# Patient Record
Sex: Female | Born: 1965 | Race: White | Hispanic: No | Marital: Married | Smoking: Never smoker
Health system: Southern US, Community
[De-identification: ages and names within clinical notes are randomized; demographics above are authoritative.]

---

## 1998-11-02 ENCOUNTER — Ambulatory Visit (HOSPITAL_COMMUNITY): Admission: RE | Admit: 1998-11-02 | Discharge: 1998-11-02 | Payer: Self-pay | Admitting: *Deleted

## 1999-11-02 ENCOUNTER — Other Ambulatory Visit: Admission: RE | Admit: 1999-11-02 | Discharge: 1999-11-02 | Payer: Self-pay | Admitting: *Deleted

## 2000-09-02 ENCOUNTER — Other Ambulatory Visit: Admission: RE | Admit: 2000-09-02 | Discharge: 2000-09-02 | Payer: Self-pay | Admitting: *Deleted

## 2000-11-14 ENCOUNTER — Encounter (INDEPENDENT_AMBULATORY_CARE_PROVIDER_SITE_OTHER): Payer: Self-pay | Admitting: Specialist

## 2000-11-14 ENCOUNTER — Ambulatory Visit (HOSPITAL_COMMUNITY): Admission: RE | Admit: 2000-11-14 | Discharge: 2000-11-14 | Payer: Self-pay | Admitting: *Deleted

## 2002-02-10 ENCOUNTER — Other Ambulatory Visit: Admission: RE | Admit: 2002-02-10 | Discharge: 2002-02-10 | Payer: Self-pay | Admitting: *Deleted

## 2003-04-26 ENCOUNTER — Other Ambulatory Visit: Admission: RE | Admit: 2003-04-26 | Discharge: 2003-04-26 | Payer: Self-pay | Admitting: *Deleted

## 2004-08-17 ENCOUNTER — Other Ambulatory Visit: Admission: RE | Admit: 2004-08-17 | Discharge: 2004-08-17 | Payer: Self-pay | Admitting: Obstetrics and Gynecology

## 2005-10-12 ENCOUNTER — Other Ambulatory Visit: Admission: RE | Admit: 2005-10-12 | Discharge: 2005-10-12 | Payer: Self-pay | Admitting: Obstetrics and Gynecology

## 2010-07-25 ENCOUNTER — Other Ambulatory Visit: Admission: RE | Admit: 2010-07-25 | Discharge: 2010-07-25 | Payer: Self-pay | Admitting: Gynecology

## 2010-07-25 ENCOUNTER — Ambulatory Visit: Payer: Self-pay | Admitting: Gynecology

## 2010-08-11 ENCOUNTER — Ambulatory Visit: Payer: Self-pay | Admitting: Gynecology

## 2011-04-20 NOTE — Op Note (Signed)
[facility]  Patient:    Nancy Quinn, Nancy Quinn                        MRN: [id number] Proc. Date: 11/14/00 Adm. Date:  [id number] Attending:  Donne Hazel                           Operative Report  PREOPERATIVE DIAGNOSIS:  Intrauterine fetal demise, first trimester.  POSTOPERATIVE DIAGNOSIS:  Intrauterine fetal demise, first trimester.  PROCEDURE:  D&E.  SURGEON:  R. Alan Mulder, M.D.  ANESTHESIA:  MAC with local supplementation.  ESTIMATED BLOOD LOSS:  50 cc.  COMPLICATIONS:  None.  FINDINGS:  Products of conception.  DESCRIPTION OF PROCEDURE:  The patient was taken to the operating room where a MAC anesthetic was administered.  The patient was placed on the operating table in the dorsolithotomy position.  The perineum and vagina was prepped and draped in the usual sterile fashion with Betadine and sterile drapes.  A speculum was placed in the vagina and the anterior lip of the cervix was grasped with a single tooth tenaculum.  The cervix was then serially dilated until a #25 Pratt dilator could easily enter the intracervical os.  Prior to this, a paracervical block was placed in the 3 oclock and 9 oclock position with 10 cc of 1% chloroprocaine or Nesacaine.  Next, a #8 curved suction curet was placed in the intrauterine cavity and the uterus was emptied in the usual fashion with a suction curet.  Three passes were made throughout the intrauterine cavity with products of conception aspirated.  The uterus was then sharply curetted with a small serrated curet and noted to be empty.  Two final passes were then made throughout the intrauterine cavity with the suction curet.  All vaginal instruments were removed.  The uterus was noted to be empty.  All vaginal instruments were removed.  The patient was then awakened and taken to the recovery room in good condition.  There were no perioperative complications.  Blood type B-positive. DD:   11/14/00 TD:  11/15/00 Job: [id number]

## 2011-08-07 ENCOUNTER — Other Ambulatory Visit: Payer: Self-pay | Admitting: *Deleted

## 2011-08-07 DIAGNOSIS — F419 Anxiety disorder, unspecified: Secondary | ICD-10-CM

## 2011-08-07 MED ORDER — SERTRALINE HCL 50 MG PO TABS: 50.0000 mg | ORAL_TABLET | Freq: Every day | ORAL | Status: DC

## 2011-10-01 ENCOUNTER — Other Ambulatory Visit: Payer: Self-pay | Admitting: *Deleted

## 2011-10-01 DIAGNOSIS — F419 Anxiety disorder, unspecified: Secondary | ICD-10-CM

## 2011-10-01 MED ORDER — SERTRALINE HCL 50 MG PO TABS: 50.0000 mg | ORAL_TABLET | Freq: Every day | ORAL | Status: DC

## 2011-10-04 ENCOUNTER — Encounter: Payer: Self-pay | Admitting: *Deleted

## 2011-10-04 NOTE — Progress Notes (Signed)
Called pharmacy to see if pt rx for sertraline 50 mg was filled and it has been.

## 2011-10-11 NOTE — Progress Notes (Signed)
CALLED PHARMACY AND LM THAT PT NO REFILL AFTER 10/01/11 REFILL DUE TO PT NEEDS TO MAKE APPOINTMENT FOR HER ANNUAL.

## 2012-01-04 ENCOUNTER — Encounter: Payer: Self-pay | Admitting: Gynecology

## 2012-01-04 ENCOUNTER — Ambulatory Visit (INDEPENDENT_AMBULATORY_CARE_PROVIDER_SITE_OTHER): Payer: 59 | Admitting: Gynecology

## 2012-01-04 ENCOUNTER — Other Ambulatory Visit (HOSPITAL_COMMUNITY)
Admission: RE | Admit: 2012-01-04 | Discharge: 2012-01-04 | Disposition: A | Discharge status: Home / Self Care | Payer: 59 | Source: Ambulatory Visit | Attending: Gynecology | Admitting: Gynecology

## 2012-01-04 VITALS — BP 116/76 | Ht 64.0 in | Wt 144.0 lb

## 2012-01-04 DIAGNOSIS — Z01419 Encounter for gynecological examination (general) (routine) without abnormal findings: Secondary | ICD-10-CM

## 2012-01-04 DIAGNOSIS — F419 Anxiety disorder, unspecified: Secondary | ICD-10-CM

## 2012-01-04 DIAGNOSIS — F411 Generalized anxiety disorder: Secondary | ICD-10-CM

## 2012-01-04 DIAGNOSIS — Z131 Encounter for screening for diabetes mellitus: Secondary | ICD-10-CM

## 2012-01-04 DIAGNOSIS — Z13220 Encounter for screening for lipoid disorders: Secondary | ICD-10-CM

## 2012-01-04 LAB — URINALYSIS W MICROSCOPIC + REFLEX CULTURE
Casts: NONE SEEN
Crystals: NONE SEEN
Leukocytes, UA: NEGATIVE
Nitrite: NEGATIVE
Specific Gravity, Urine: 1.015 (ref 1.005–1.030)
Urobilinogen, UA: 0.2 mg/dL (ref 0.0–1.0)
pH: 8.5 — ABNORMAL HIGH (ref 5.0–8.0)

## 2012-01-04 MED ORDER — SERTRALINE HCL 50 MG PO TABS: 50.0000 mg | ORAL_TABLET | Freq: Every day | ORAL | Status: DC

## 2012-01-04 MED ORDER — NORETHINDRONE ACET-ETHINYL EST 1-20 MG-MCG PO TABS: 1.0000 | ORAL_TABLET | Freq: Every day | ORAL | Status: DC

## 2012-01-04 NOTE — Patient Instructions (Signed)
Resume Zoloft as we discussed. Follow up in one year for your annual gynecologic check up.

## 2012-01-04 NOTE — Progress Notes (Signed)
Addended by: Richardson Chiquito on: 01/04/2012 03:37 PM   Modules accepted: Orders

## 2012-01-04 NOTE — Progress Notes (Signed)
Nancy Quinn 10/24/1966 [id number]        46 y.o.  for annual exam.  Doing well.  Past medical history,surgical history, medications, allergies, family history and social history were all reviewed and documented in the EPIC chart. ROS:  Was performed and pertinent positives and negatives are included in the history.  Exam: Sherrilyn Rist chaperone present Filed Vitals:   01/04/12 1500  BP: 116/76   General appearance  Normal Skin grossly normal Head/Neck normal with no cervical or supraclavicular adenopathy thyroid normal Lungs  clear Cardiac RR, without RMG Abdominal  soft, nontender, without masses, organomegaly or hernia Breasts  examined lying and sitting without masses, retractions, discharge or axillary adenopathy. Pelvic  Ext/BUS/vagina  normal   Cervix  normal  Pap done  Uterus  anteverted, normal size, shape and contour, midline and mobile nontender   Adnexa  Without masses or tenderness    Anus and perineum  normal   Rectovaginal  normal sphincter tone without palpated masses or tenderness.    Assessment/Plan:  46 y.o. female for annual exam.    1. Contraception. Patient is on Loestrin 120 equivalent wants to continue. Risks of stroke heart attack DVT have been discussed understood and accepted. A refill for times a year. 2. Anxiety/stress. Patient's a new job having a lot more stress associated with this.  Had been on Zoloft 50 mg doing well but she stopped it herself in August initially did well but now feels that she needs to restart it. I refilled her times a year. I discussed side effect profile as well as the need to report feeling worse instead of feeling better in the issues of suicide ideation. 3. Breast health. She's not had a mammogram and I strongly urged her to schedule this and she agrees. SBE monthly reviewed. 4. Pap smear. I did a Pap smear today as she only has one normal in our chart. She has no history of abnormalities in the past. 5. Health maintenance. Will  check baseline CBC glucose lipid profile and urinalysis. Assuming she continues well for gynecologic standpoint she will see me in a year, sooner as needed.    Dara Lords MD, 3:22 PM 01/04/2012

## 2012-01-05 LAB — CBC WITH DIFFERENTIAL/PLATELET
Basophils Absolute: 0 10*3/uL (ref 0.0–0.1)
Basophils Relative: 1 % (ref 0–1)
Eosinophils Absolute: 0 10*3/uL (ref 0.0–0.7)
Eosinophils Relative: 0 % (ref 0–5)
Lymphocytes Relative: 35 % (ref 12–46)
MCHC: 32.2 g/dL (ref 30.0–36.0)
MCV: 96.5 fL (ref 78.0–100.0)
Monocytes Absolute: 0.3 10*3/uL (ref 0.1–1.0)
Platelets: 299 10*3/uL (ref 150–400)
RDW: 12.1 % (ref 11.5–15.5)
WBC: 6.4 10*3/uL (ref 4.0–10.5)

## 2012-01-05 LAB — LIPID PANEL
LDL Cholesterol: 174 mg/dL — ABNORMAL HIGH (ref 0–99)
Triglycerides: 80 mg/dL (ref <150)
VLDL: 16 mg/dL (ref 0–40)

## 2012-01-09 ENCOUNTER — Other Ambulatory Visit: Payer: Self-pay | Admitting: *Deleted

## 2012-01-09 DIAGNOSIS — E7800 Pure hypercholesterolemia, unspecified: Secondary | ICD-10-CM

## 2012-05-09 ENCOUNTER — Encounter: Payer: Self-pay | Admitting: Family Medicine

## 2012-05-09 ENCOUNTER — Ambulatory Visit
Admission: RE | Admit: 2012-05-09 | Discharge: 2012-05-09 | Disposition: A | Discharge status: Home / Self Care | Payer: 59 | Source: Ambulatory Visit | Attending: Family Medicine | Admitting: Family Medicine

## 2012-05-09 ENCOUNTER — Ambulatory Visit (INDEPENDENT_AMBULATORY_CARE_PROVIDER_SITE_OTHER): Payer: 59 | Admitting: Family Medicine

## 2012-05-09 VITALS — BP 138/86 | HR 72 | Wt 146.0 lb

## 2012-05-09 DIAGNOSIS — M79671 Pain in right foot: Secondary | ICD-10-CM

## 2012-05-09 DIAGNOSIS — M79609 Pain in unspecified limb: Secondary | ICD-10-CM

## 2012-05-09 NOTE — Progress Notes (Signed)
  Subjective:    Patient ID: Nancy Quinn, female    DOB: 1966/01/02, 46 y.o.   MRN: [id number]  HPI She is here for evaluation of a two-year history of right mid foot pain. No history of previous injury to this area. No relation to running or other physical activities. She has used orthotics in the past without success . The pain is present whether active or sitting . She does not complaining of numbness. She has tried different shoes and now is wearing shoes that apparently put some weight on the lateral aspect of her foot which has helped.    Review of Systems     Objective:   Physical Exam Full motion of the ankle. No laxity is noted. Strength is normal. She does have tenderness to palpation over the talonavicular area. Tinel's test is negative       Assessment & Plan:  Foot pain, etiology unclear. Refer to orthopedics

## 2012-05-09 NOTE — Patient Instructions (Signed)
The orthopedic office will call you with an appointment

## 2013-01-26 ENCOUNTER — Other Ambulatory Visit: Payer: Self-pay

## 2013-01-26 MED ORDER — NORETHIN ACE-ETH ESTRAD-FE 1-20 MG-MCG PO TABS: 1.0000 | ORAL_TABLET | Freq: Every day | ORAL | Status: DC

## 2013-01-26 MED ORDER — SERTRALINE HCL 50 MG PO TABS: 50.0000 mg | ORAL_TABLET | Freq: Every day | ORAL | Status: DC

## 2013-01-30 ENCOUNTER — Other Ambulatory Visit: Payer: Self-pay | Admitting: *Deleted

## 2013-01-30 NOTE — Telephone Encounter (Signed)
Annual scheduled for end of March 2014

## 2013-02-02 ENCOUNTER — Other Ambulatory Visit: Payer: Self-pay

## 2013-02-02 MED ORDER — NORETHIN ACE-ETH ESTRAD-FE 1-20 MG-MCG PO TABS: 1.0000 | ORAL_TABLET | Freq: Every day | ORAL | Status: DC

## 2013-02-03 ENCOUNTER — Other Ambulatory Visit: Payer: Self-pay | Admitting: *Deleted

## 2013-02-03 MED ORDER — NORETHIN ACE-ETH ESTRAD-FE 1-20 MG-MCG PO TABS: 1.0000 | ORAL_TABLET | Freq: Every day | ORAL | Status: DC

## 2013-02-06 ENCOUNTER — Encounter: Payer: 59 | Admitting: Gynecology

## 2013-02-27 ENCOUNTER — Encounter: Payer: 59 | Admitting: Gynecology

## 2013-03-10 ENCOUNTER — Other Ambulatory Visit: Payer: Self-pay

## 2013-03-10 DIAGNOSIS — F419 Anxiety disorder, unspecified: Secondary | ICD-10-CM

## 2013-03-10 MED ORDER — SERTRALINE HCL 50 MG PO TABS: 50.0000 mg | ORAL_TABLET | Freq: Every day | ORAL | Status: DC

## 2013-03-10 NOTE — Telephone Encounter (Signed)
Patient has her yearly exam scheduled 03/13/13.

## 2013-03-13 ENCOUNTER — Encounter: Payer: Self-pay | Admitting: Gynecology

## 2013-06-08 IMAGING — CR DG FOOT COMPLETE 3+V*R*
3 series · 3 of 3 positions shown · non-contrast
Comparison: None.

CLINICAL DATA: Foot pain.  Runner.  Medial pain near navicular.

RIGHT FOOT COMPLETE - 3+ VIEW

[view not recorded (1 of 3)]
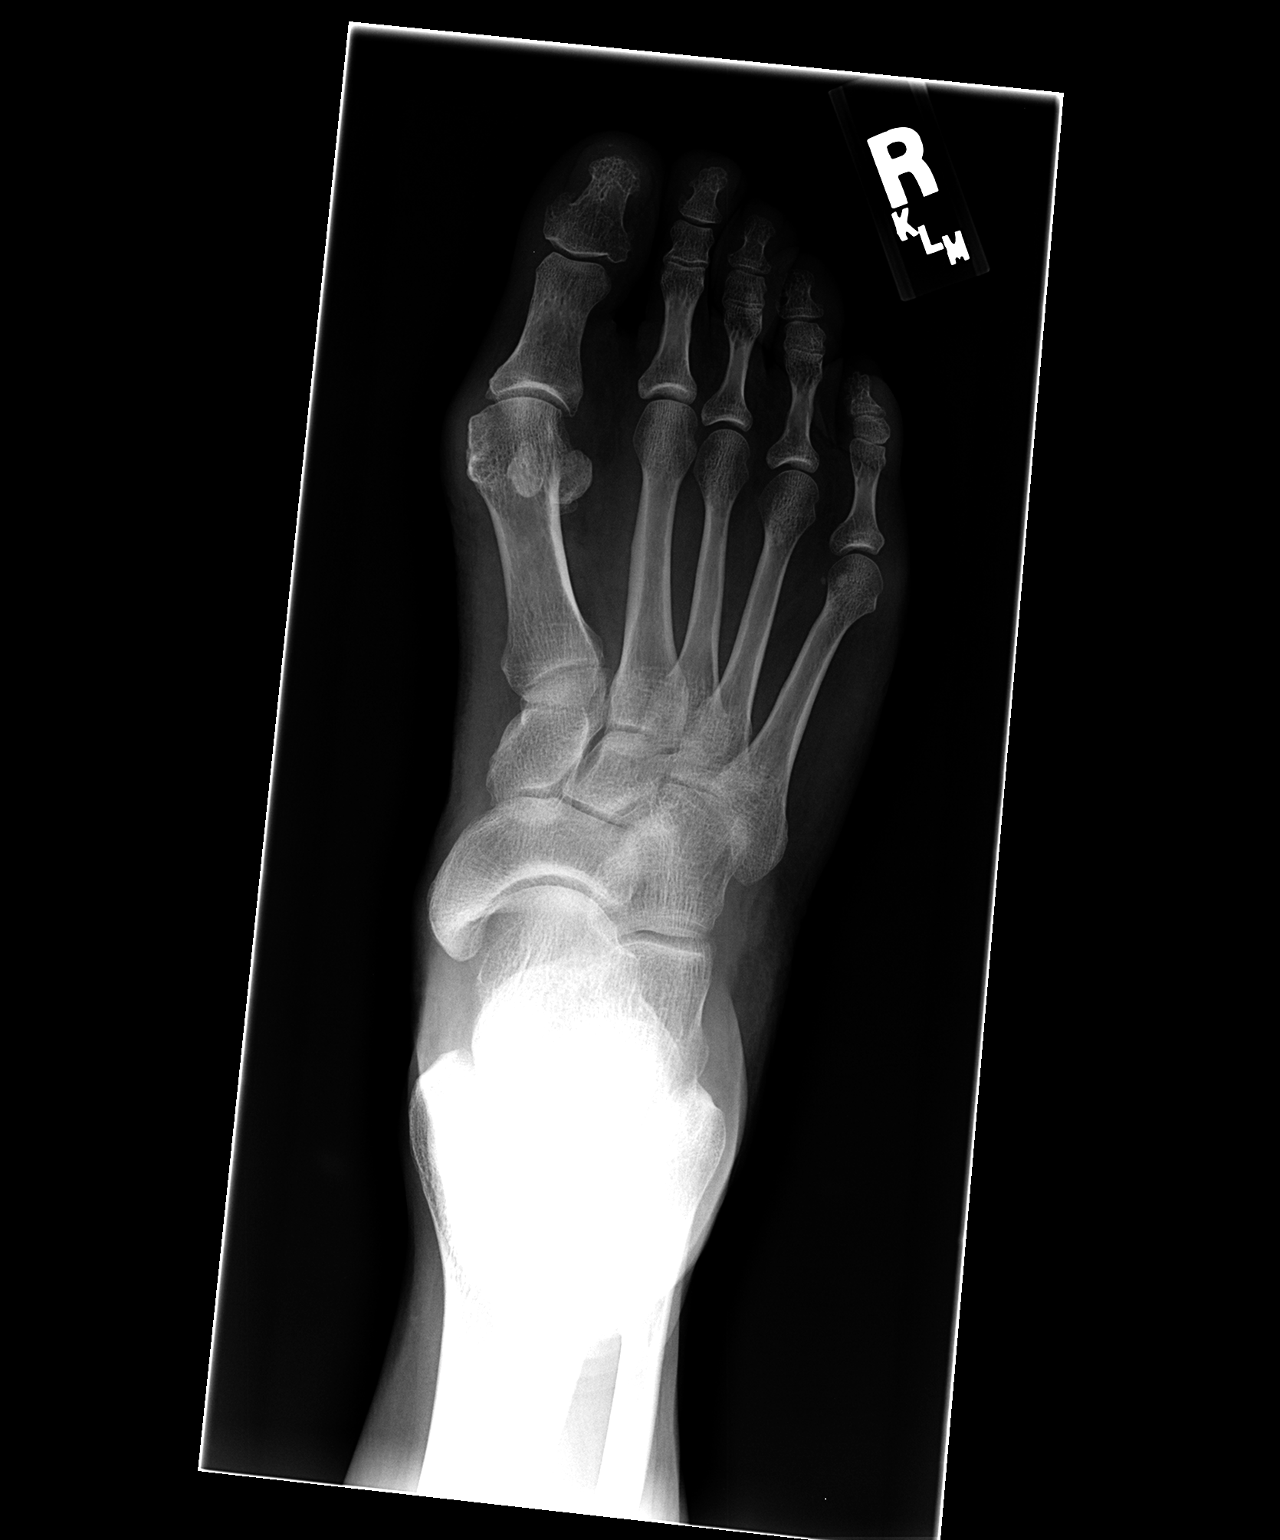

[view not recorded (2 of 3)]
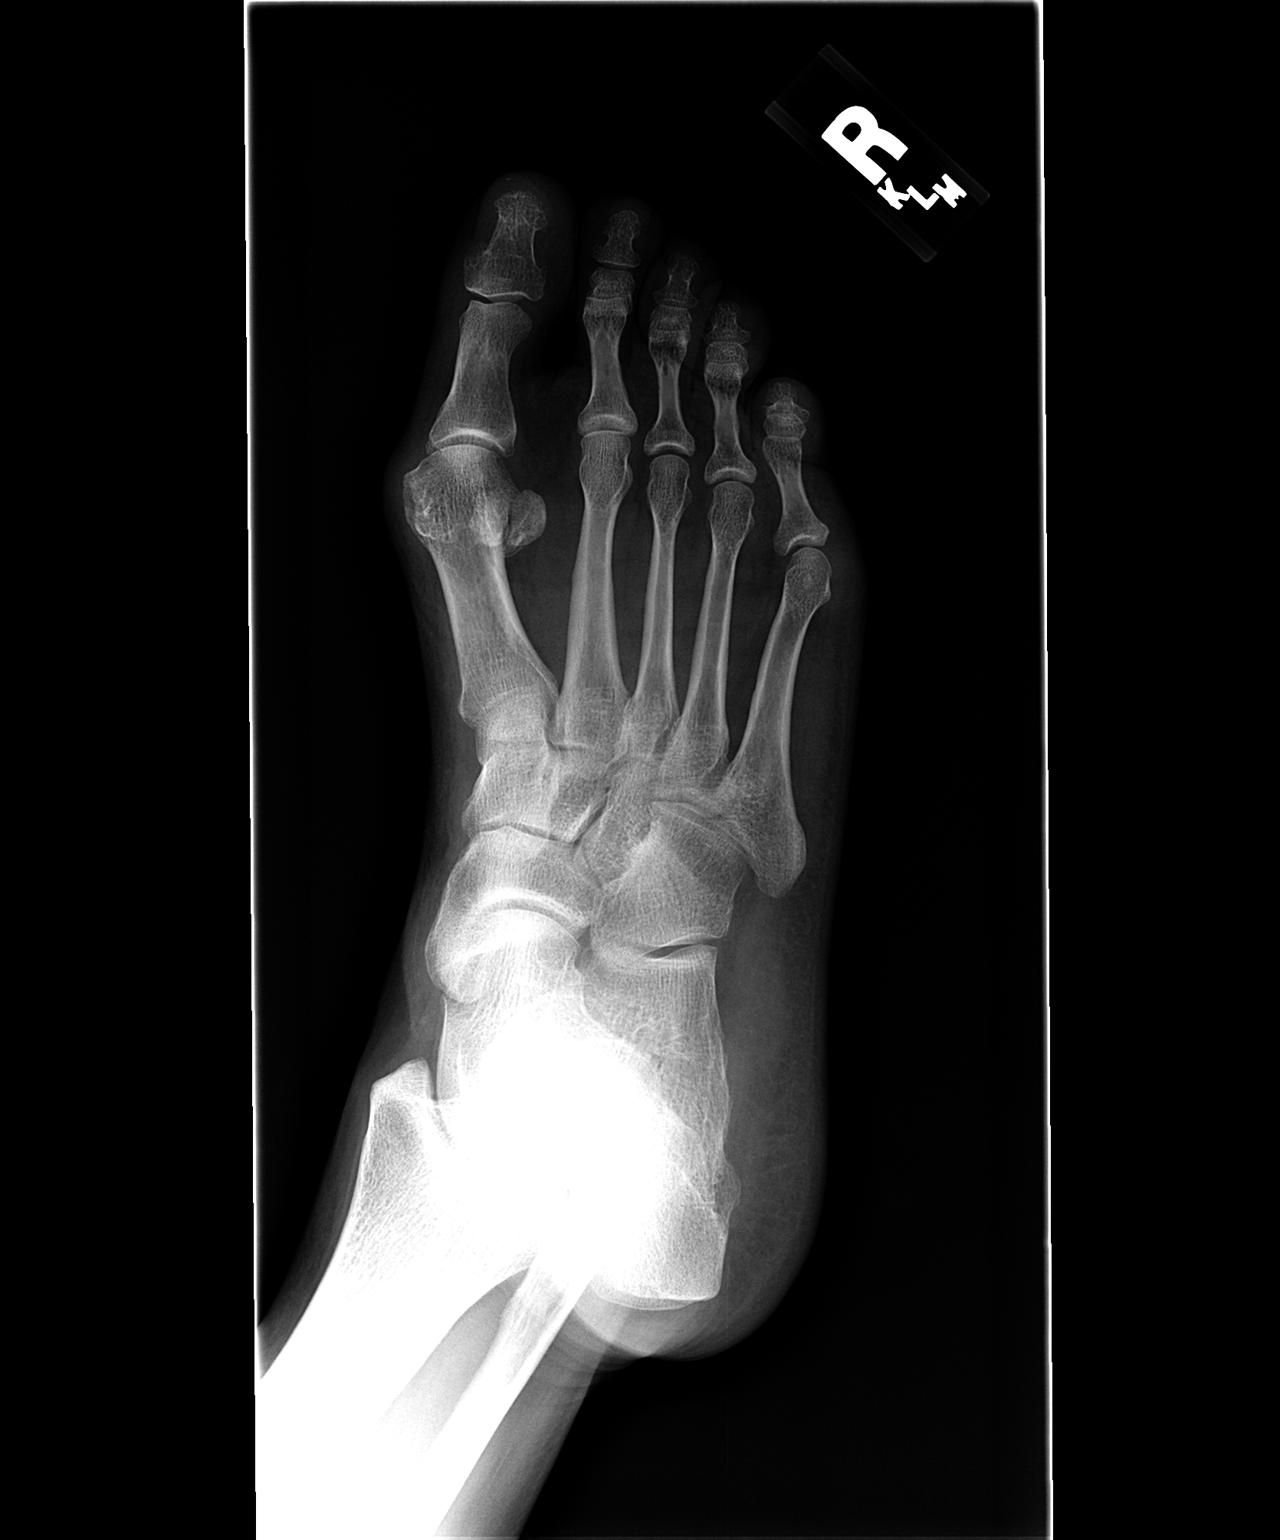

[view not recorded (3 of 3)]
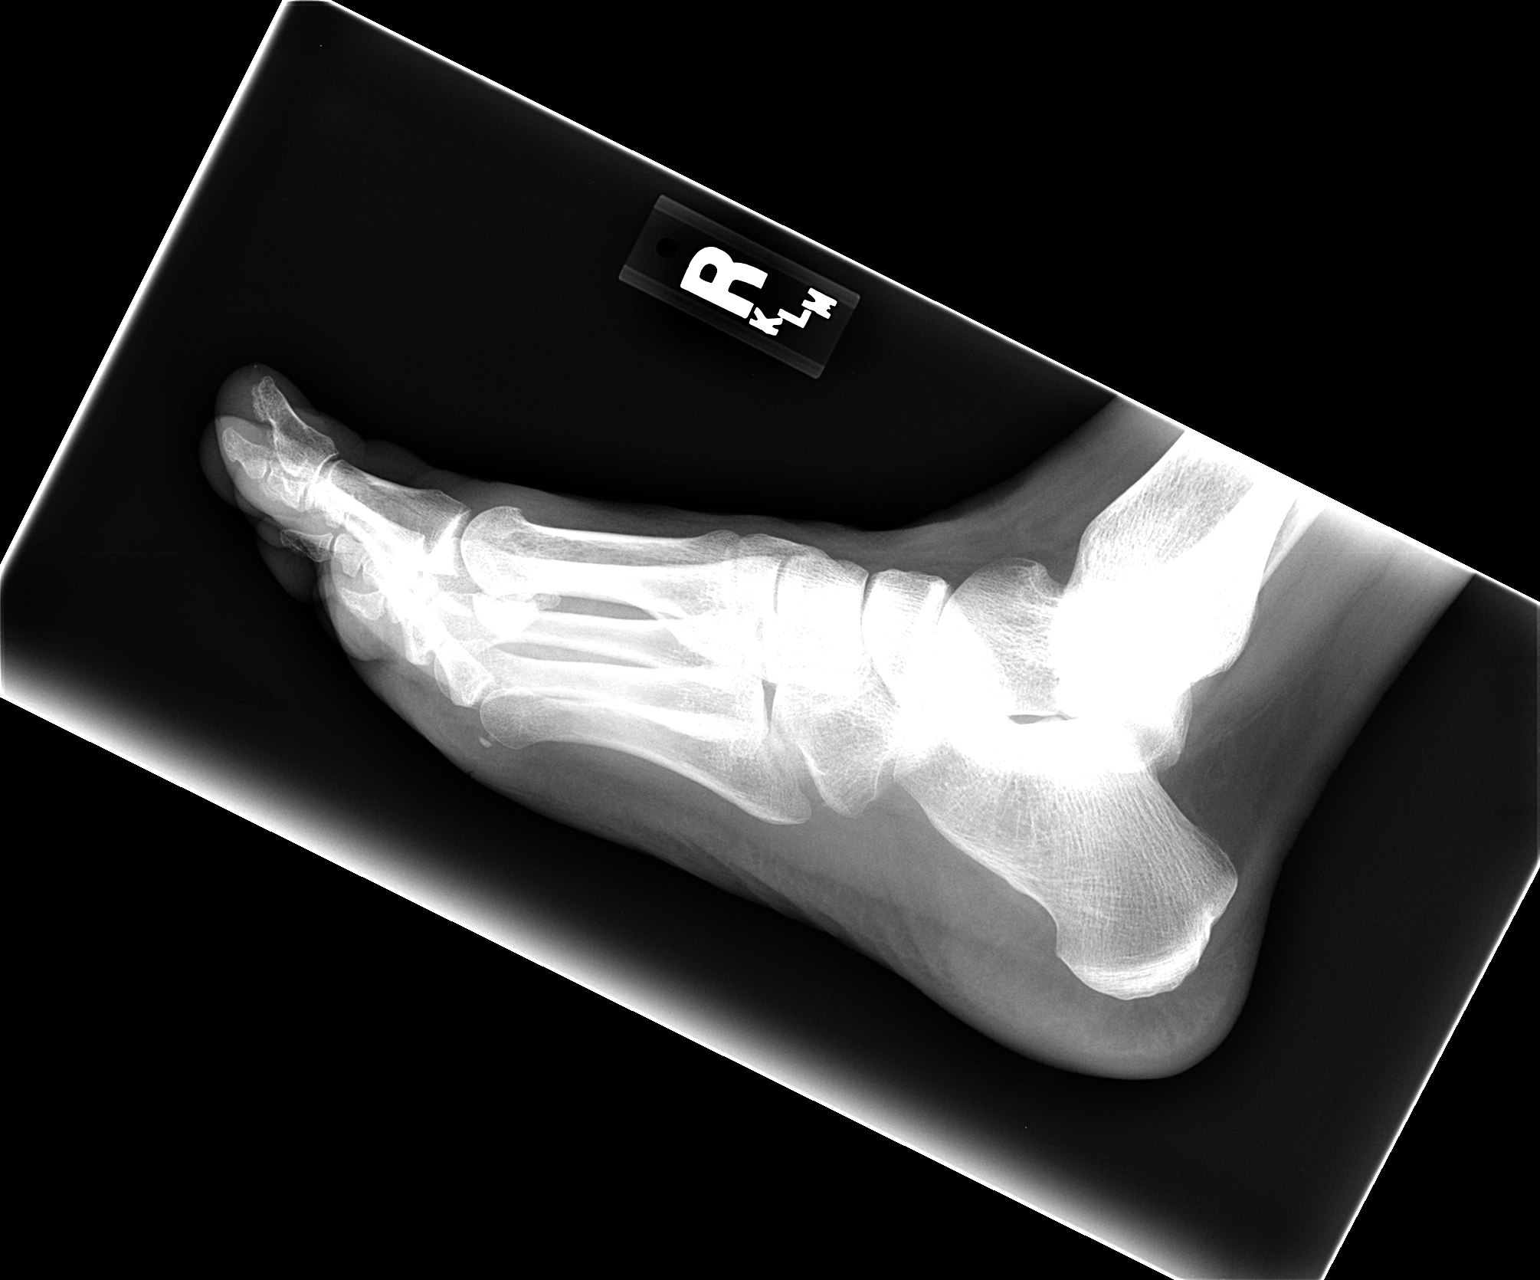

[3 of 3 positions shown; findings below may reference images not displayed]

FINDINGS: Mild hallux valgus deformity. No acute fracture or
dislocation.  No periosteal reaction or callus deposition.
IMPRESSION: No acute osseous abnormality.

## 2013-09-16 ENCOUNTER — Telehealth: Payer: Self-pay | Admitting: Family Medicine

## 2013-09-16 NOTE — Telephone Encounter (Signed)
Pt's husband called and stated that they would like to have you recommend another ortho. Doc. They are interested in getting a second opinion. Please call pt.

## 2013-09-17 NOTE — Telephone Encounter (Signed)
Second opinion given

## 2014-10-04 ENCOUNTER — Encounter: Payer: Self-pay | Admitting: Family Medicine

## 2019-12-31 ENCOUNTER — Other Ambulatory Visit: Payer: Self-pay

## 2019-12-31 ENCOUNTER — Encounter: Payer: Self-pay | Admitting: Family Medicine

## 2019-12-31 ENCOUNTER — Ambulatory Visit: Payer: BC Managed Care – PPO | Admitting: Family Medicine

## 2019-12-31 VITALS — HR 91 | Temp 98.7°F | Wt 135.0 lb

## 2019-12-31 DIAGNOSIS — H9209 Otalgia, unspecified ear: Secondary | ICD-10-CM

## 2019-12-31 DIAGNOSIS — R509 Fever, unspecified: Secondary | ICD-10-CM | POA: Diagnosis not present

## 2019-12-31 DIAGNOSIS — R42 Dizziness and giddiness: Secondary | ICD-10-CM

## 2019-12-31 DIAGNOSIS — R0981 Nasal congestion: Secondary | ICD-10-CM | POA: Diagnosis not present

## 2019-12-31 DIAGNOSIS — R519 Headache, unspecified: Secondary | ICD-10-CM

## 2019-12-31 NOTE — Progress Notes (Signed)
   Subjective:  Documentation for virtual audio and video telecommunications through [place] encounter:  The patient was located at home. 2 patient identifiers used.  The provider was located in the office. The patient did consent to this visit and is aware of possible charges through their insurance for this visit.  The other persons participating in this telemedicine service were her husband.     Patient ID: Nancy Quinn, female    DOB: 1965-12-25, 54 y.o.   MRN: [id number]  HPI Chief Complaint  Patient presents with  . ear infection    vertigo during middle of night for last 2 nights, left ear inflammed, sore.    Complains of intermittent year long history of dizziness in the middle of the night  Spinning sensation when she lays her head back.    2 nights ago she was awakened 6 times with this.  Episodes are brief typically, lasting longer for the past 2 days.   She also reports episodes of feeling hot and then cold, nasal congestion, frontal headache, left ear pain and decreased appetite  Feels well hydrated. Has been taking ibuprofen.   States she has been diagnosed with glaucoma.   Denies numbness, tingling or weakness. No chest pain, palpitations, shortness of breath, cough, abdominal pain, N/V/D.  No loss of taste or smell.   Reviewed allergies, medications, past medical, surgical, family, and social history.  Review of Systems Pertinent positives and negatives in the history of present illness.     Objective:   Physical Exam Pulse 91   Temp 98.7 F (37.1 C)   Wt 135 lb (61.2 kg)   BMI 23.17 kg/m   Alert and oriented and in no acute distress.  No facial asymmetry.  Respirations unlabored.  Normal speech, mood and thought process.      Assessment & Plan:  Dizziness  Nasal congestion  Otalgia, unspecified laterality  Chills with fever  Acute nonintractable headache, unspecified headache type  Discussed limitations of a virtual visit.  Dizziness  x 1 year intermittent and with position changes that does sound like BPPV. No red flag symptoms.  Recommend symptomatic treatment with meclizine. She may also take Tylenol or ibuprofen for headache. Hydrate and rest.  She will quarantine and get a Covid test through [facility].  Discussed the possibility of underlying sinusitis vs ear infection. Asked her to keep me updated on any new symptoms.  Follow up with me Monday virtually or she may need to be seen sooner if worsening.

## 2020-01-01 ENCOUNTER — Ambulatory Visit: Payer: BC Managed Care – PPO | Attending: Internal Medicine

## 2020-01-01 DIAGNOSIS — Z20822 Contact with and (suspected) exposure to covid-19: Secondary | ICD-10-CM

## 2020-01-02 LAB — NOVEL CORONAVIRUS, NAA: SARS-CoV-2, NAA: NOT DETECTED

## 2020-01-04 ENCOUNTER — Other Ambulatory Visit: Payer: Self-pay

## 2020-01-04 ENCOUNTER — Encounter: Payer: Self-pay | Admitting: Family Medicine

## 2020-01-04 ENCOUNTER — Ambulatory Visit: Payer: BC Managed Care – PPO | Admitting: Family Medicine

## 2020-01-04 VITALS — Wt 135.0 lb

## 2020-01-04 DIAGNOSIS — J0140 Acute pansinusitis, unspecified: Secondary | ICD-10-CM

## 2020-01-04 MED ORDER — DOXYCYCLINE HYCLATE 100 MG PO TABS: 100.0000 mg | ORAL_TABLET | Freq: Two times a day (BID) | ORAL | 0 refills | Status: AC

## 2020-01-04 NOTE — Progress Notes (Signed)
   Subjective:  Documentation for virtual audio and video telecommunications through [place] encounter:  The patient was located at home. 2 patient identifiers used.  The provider was located in the office. The patient did consent to this visit and is aware of possible charges through their insurance for this visit.  The other persons participating in this telemedicine service were none.    Patient ID: Nancy Quinn, female    DOB: [birth year], 54 y.o.   MRN: [id number]  HPI Chief Complaint  Patient presents with  . follow-up    follow-up on chills, dizziness and ear pain. feeling better   This is a follow up on URI symptoms from last week. States did get a negative Covid-19 test result.  Symptoms now are severe nasal congestion and sinus pressure on the left worse than right. Also having post nasal drainage.  Left ear pain has improved.   Denies fever, chills, body aches, sore throat, cough, chest pain, shortness of breath, vomiting or diarrhea.   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 This makes her sedated.  Complains of intermittent year long history of dizziness in the middle of the night  Spinning sensation when she lays her head back.  Reviewed allergies, medications, past medical, surgical, family, and social history.   Review of Systems Pertinent positives and negatives in the history of present illness.     Objective:   Physical Exam Wt 135 lb (61.2 kg)   BMI 23.17 kg/m   Alert and oriented and in no acute distress.  She sounds nasally congested.  Left frontal and maxillary sinus tenderness when she palpates them.  Respirations unlabored.      Assessment & Plan:  Acute non-recurrent pansinusitis - Plan: doxycycline (VIBRA-TABS) 100 MG tablet  Discussed limitations of virtual visit. Her symptoms now seem more consistent with acute sinusitis on the left.  Reviewed negative Covid test result. I will prescribe an antibiotic and she may take Sudafed.  Continue hydrating and taking Tylenol as needed.  Follow up if worsening or not back to baseline after the antibiotic.  Discussed the year long intermittent dizziness at night should be evaluated further and I recommend following up with her PCP, Dr. Redmond School for this.   Time spent on call was 10 minutes and in review of previous records 15 minutes total.  This virtual service is not related to other E/M service within previous 7 days.

## 2020-03-29 DIAGNOSIS — H5213 Myopia, bilateral: Secondary | ICD-10-CM | POA: Diagnosis not present

## 2020-05-03 DIAGNOSIS — H40023 Open angle with borderline findings, high risk, bilateral: Secondary | ICD-10-CM | POA: Diagnosis not present

## 2020-06-16 DIAGNOSIS — H40023 Open angle with borderline findings, high risk, bilateral: Secondary | ICD-10-CM | POA: Diagnosis not present

## 2020-09-08 DIAGNOSIS — H40023 Open angle with borderline findings, high risk, bilateral: Secondary | ICD-10-CM | POA: Diagnosis not present

## 2020-09-13 DIAGNOSIS — L719 Rosacea, unspecified: Secondary | ICD-10-CM | POA: Diagnosis not present

## 2020-11-15 DIAGNOSIS — H019 Unspecified inflammation of eyelid: Secondary | ICD-10-CM | POA: Diagnosis not present

## 2020-11-15 DIAGNOSIS — L719 Rosacea, unspecified: Secondary | ICD-10-CM | POA: Diagnosis not present

## 2021-02-14 DIAGNOSIS — L719 Rosacea, unspecified: Secondary | ICD-10-CM | POA: Diagnosis not present

## 2021-03-30 DIAGNOSIS — H5213 Myopia, bilateral: Secondary | ICD-10-CM | POA: Diagnosis not present

## 2021-04-10 ENCOUNTER — Encounter: Payer: Self-pay | Admitting: Family Medicine

## 2021-04-10 ENCOUNTER — Telehealth: Payer: BC Managed Care – PPO | Admitting: Family Medicine

## 2021-04-10 VITALS — Temp 98.4°F | Ht 62.0 in | Wt 135.0 lb

## 2021-04-10 DIAGNOSIS — U071 COVID-19: Secondary | ICD-10-CM | POA: Diagnosis not present

## 2021-04-10 NOTE — Progress Notes (Signed)
   Subjective:    Patient ID: Nancy Quinn, female    DOB: 25-Jan-1966, 55 y.o.   MRN: [id number]  HPI Documentation for virtual audio and video telecommunications through [place] encounter: The patient was located at home. 2 patient identifiers used.  The provider was located in the office. The patient did consent to this visit and is aware of possible charges through their insurance for this visit. The other persons participating in this telemedicine service were none. Time spent on call was 5 minutes and in review of previous records >20 minutes total for counseling and coordination of care. This virtual service is not related to other E/M service within previous 7 days. She started having difficulty with malaise and fatigue last Friday and worsened on Saturday and over the weekend with myalgias, shortness of breath, congestion, headache, dizziness and loss of smell and taste and muffled hearing in the left ear.  She tested Saturday and was positive.  She is using DayQuil, NyQuil and ibuprofen.  She has had 3 vaccines.  Review of Systems     Objective:   Physical Exam Alert and toxic appearing with normal breathing pattern       Assessment & Plan:  COVID Discussed continuing on her OTC medications and recommend 800 mg of ibuprofen 3 times per day.  Discussed worsening of symptoms in regard to cough, fever, shortness of breath and to call if there is any questions.  Recommend decongestant to help with ear congestion. Discussed antiviral medications and at the present time it is not indicated.  She was comfortable with that.

## 2021-08-01 DIAGNOSIS — H40023 Open angle with borderline findings, high risk, bilateral: Secondary | ICD-10-CM | POA: Diagnosis not present

## 2021-10-19 DIAGNOSIS — L578 Other skin changes due to chronic exposure to nonionizing radiation: Secondary | ICD-10-CM | POA: Diagnosis not present

## 2021-10-19 DIAGNOSIS — L821 Other seborrheic keratosis: Secondary | ICD-10-CM | POA: Diagnosis not present

## 2021-10-19 DIAGNOSIS — D1801 Hemangioma of skin and subcutaneous tissue: Secondary | ICD-10-CM | POA: Diagnosis not present

## 2021-10-19 DIAGNOSIS — Z23 Encounter for immunization: Secondary | ICD-10-CM | POA: Diagnosis not present

## 2021-10-19 DIAGNOSIS — L814 Other melanin hyperpigmentation: Secondary | ICD-10-CM | POA: Diagnosis not present

## 2022-02-22 ENCOUNTER — Other Ambulatory Visit: Payer: Self-pay

## 2022-02-22 ENCOUNTER — Encounter: Payer: Self-pay | Admitting: Physician Assistant

## 2022-02-22 ENCOUNTER — Ambulatory Visit: Payer: BC Managed Care – PPO | Admitting: Physician Assistant

## 2022-02-22 VITALS — BP 140/100 | HR 79 | Ht 62.0 in | Wt 147.6 lb

## 2022-02-22 DIAGNOSIS — M62838 Other muscle spasm: Secondary | ICD-10-CM

## 2022-02-22 DIAGNOSIS — Z6827 Body mass index (BMI) 27.0-27.9, adult: Secondary | ICD-10-CM

## 2022-02-22 DIAGNOSIS — S29011A Strain of muscle and tendon of front wall of thorax, initial encounter: Secondary | ICD-10-CM

## 2022-02-22 MED ORDER — METHOCARBAMOL 500 MG PO TABS: 1000.0000 mg | ORAL_TABLET | Freq: Every evening | ORAL | 0 refills | Status: DC | PRN

## 2022-02-22 MED ORDER — DICLOFENAC SODIUM 75 MG PO TBEC: 75.0000 mg | DELAYED_RELEASE_TABLET | Freq: Two times a day (BID) | ORAL | 0 refills | Status: DC

## 2022-02-22 NOTE — Progress Notes (Signed)
? ?Acute Office Visit ? ?Subjective:  ? ? Patient ID: Nancy Quinn, female    DOB: [birth year], 56 y.o.   MRN: [id number] ? ?Chief Complaint  ?Patient presents with  ? Chest Pain  ?  Pt said last night she experienced chest pain. It hurts when she breathes and turn her head. 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 Denies numbness and tingling.  ? ? 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 ? ? ? ?Outpatient Medications Prior to Visit  ?Medication Sig Dispense Refill  ? brimonidine (ALPHAGAN) 0.2 % ophthalmic solution SMARTSIG:In Eye(s)    ? doxycycline (VIBRA-TABS) 100 MG tablet Take 1 tablet (100 mg total) by mouth 2 (two) times daily. 20 tablet 0  ? Latanoprostene Bunod (VYZULTA) 0.024 % SOLN     ? doxycycline (VIBRAMYCIN) 100 MG capsule Take 100 mg by mouth daily. (Patient not taking: Reported on 04/10/2021)    ? ?No facility-administered medications prior to visit.  ? ? ?Allergies  ?Allergen Reactions  ? Penicillins   ? ? ?Review of Systems  ?Constitutional:  Negative for activity change and chills.  ?HENT:  Negative for congestion and voice change.   ?Eyes:  Negative for pain and redness.  ?Respiratory:  Negative for cough, shortness of breath and wheezing.   ?Cardiovascular:  Negative for chest pain, palpitations and leg swelling.  ?Gastrointestinal:  Negative for constipation, diarrhea, nausea and vomiting.  ?Endocrine: Negative  for polyuria.  ?Genitourinary:  Negative for frequency.  ?Musculoskeletal:  Positive for myalgias and neck stiffness.  ?Skin:  Negative for color change and rash.  ?Allergic/Immunologic: Negative for immunocompromised state.  ?Neurological:  Negative for dizziness and numbness.  ?Psychiatric/Behavioral:  Negative for agitation.   ? ?   ?Objective:  ?  ?Physical Exam ?Vitals and nursing note reviewed.  ?Constitutional:   ?   General: She is not in acute distress. ?   Appearance: Normal appearance. She is not ill-appearing.  ?HENT:  ?   Head: Normocephalic and atraumatic.  ?   Right Ear: External ear normal.  ?   Left Ear: External ear normal.  ?   Nose: No congestion.  ?Eyes:  ?   Extraocular Movements: Extraocular movements intact.  ?   Conjunctiva/sclera: Conjunctivae normal.  ?   Pupils: Pupils are equal, round, and reactive to light.  ?Cardiovascular:  ?   Rate and Rhythm: Normal rate and regular rhythm.  ?   Pulses: Normal pulses.  ?   Heart sounds: Normal heart sounds.  ?Pulmonary:  ?   Effort: Pulmonary effort is normal.  ?   Breath sounds: Normal breath sounds. No wheezing.  ?Chest:  ?   Chest wall: Tenderness present. No mass, lacerations, deformity, swelling or crepitus.  ? ? ?Abdominal:  ?   General: Bowel sounds are normal.  ?  Palpations: Abdomen is soft.  ?Musculoskeletal:  ?   Cervical back: Normal range of motion and neck supple.  ?   Right lower leg: No edema.  ?   Left lower leg: No edema.  ?Skin: ?   General: Skin is warm and dry.  ?   Findings: No bruising.  ?Neurological:  ?   General: No focal deficit present.  ?   Mental Status: She is alert and oriented to person, place, and time.  ?Psychiatric:     ?   Mood and Affect: Mood normal.     ?   Behavior: Behavior normal.     ?   Thought Content: Thought content normal.  ? ? ?BP (!) 140/100   Pulse 79   Wt 147 lb 9.6 oz (67 kg)   SpO2 99%   BMI 27.00 kg/m?  ? ?Wt Readings from Last 3 Encounters:  ?02/22/22 147 lb 9.6 oz (67 kg)  ?04/10/21 135  lb (61.2 kg)  ?01/04/20 135 lb (61.2 kg)  ? ? ?Results for orders placed or performed in visit on 01/01/20  ?Novel Coronavirus, NAA ([facility])  ? Specimen: Nasopharyngeal(NP) swabs in vial transport medium  ? NASOPHARYNGE  TESTING  ?Result Value Ref Range  ? SARS-CoV-2, NAA Not Detected Not Detected  ? ? ?   ?Assessment & Plan:  ?1. Muscle strain of chest wall, initial encounter ?- DG Chest 2 View; Future ?- nsaid with food, diclofenac 75 mg bid prn,Tylenol (generic is acetamenophen); Aspercreme with lidocaine; Muscle rubs like Biofreeze, IcyHot, Bengay; Voltaren gel (generic is Diclofenac sodium); If your symptoms get worse, please call 911 / EMS for help or go to the Emergency Department for further evaluation ? ?2. Spasm of muscle ?- DG Chest 2 View; Future ?- muscle relaxer methocarbamol as needed, not while working or driving ?- can get CXR if no improvement ? ?3. Body mass index (BMI) 27.0-27.9, adult ? ? ? ?Meds ordered this encounter  ?Medications  ? methocarbamol (ROBAXIN) 500 MG tablet  ?  Sig: Take 2 tablets (1,000 mg total) by mouth at bedtime as needed for muscle spasms.  ?  Dispense:  30 tablet  ?  Refill:  0  ?  Order Specific Question:   Supervising Provider  ?  Answer:   Denita Lung [[id number]]  ? diclofenac (VOLTAREN) 75 MG EC tablet  ?  Sig: Take 1 tablet (75 mg total) by mouth 2 (two) times daily.  ?  Dispense:  30 tablet  ?  Refill:  0  ?  Order Specific Question:   Supervising Provider  ?  Answer:   Denita Lung [[id number]]  ? ? ?Return in about 6 months (around 08/25/2022) for Return for Annual Exam. ? ?Irene Pap, PA-C ?

## 2022-02-22 NOTE — Patient Instructions (Addendum)
You can walk in for the x-ray: ?Diagnostic Radiology and Imaging ? ?[facility] ?[address] ?[address] ?[place], [place] [address] ? ?Phone [phone] ?Fax [phone] ? ?Hours of Operation ?General hours of operation are Monday - Friday, 8 am-5 pm  ? ?You can take OTC pain medicine as needed: ? ?Tylenol (generic is acetamenophen) ? ?Aspercreme with lidocaine ?Muscle rubs like Biofreeze, IcyHot, Bengay ? ?Voltaren gel (generic is Diclofenac sodium) ? ?

## 2022-02-23 DIAGNOSIS — L821 Other seborrheic keratosis: Secondary | ICD-10-CM | POA: Insufficient documentation

## 2022-02-23 DIAGNOSIS — L814 Other melanin hyperpigmentation: Secondary | ICD-10-CM | POA: Insufficient documentation

## 2022-02-23 DIAGNOSIS — L719 Rosacea, unspecified: Secondary | ICD-10-CM | POA: Insufficient documentation

## 2022-02-23 DIAGNOSIS — L578 Other skin changes due to chronic exposure to nonionizing radiation: Secondary | ICD-10-CM | POA: Insufficient documentation

## 2022-02-23 DIAGNOSIS — D1801 Hemangioma of skin and subcutaneous tissue: Secondary | ICD-10-CM | POA: Insufficient documentation

## 2022-02-23 DIAGNOSIS — Z6827 Body mass index (BMI) 27.0-27.9, adult: Secondary | ICD-10-CM | POA: Insufficient documentation

## 2022-02-23 NOTE — Assessment & Plan Note (Signed)
Stable, drink 8 - 10 glasses of water daily, limit sugar intake and eat a low fat diet, exercise 3 - 5 days a week, example walking 1 - 2 miles  ? ?

## 2022-03-27 DIAGNOSIS — H401131 Primary open-angle glaucoma, bilateral, mild stage: Secondary | ICD-10-CM | POA: Diagnosis not present

## 2022-06-25 ENCOUNTER — Encounter: Payer: BC Managed Care – PPO | Admitting: Family Medicine

## 2022-06-25 NOTE — Progress Notes (Deleted)
   Complete physical exam  Patient: Nancy Quinn   DOB: Aug 20, 1966   56 y.o. Female  MRN: [id number]  Subjective:    No chief complaint on file.   Nancy Quinn is a 56 y.o. female who presents today for a complete physical exam. She reports consuming a {diet types:17450} diet. {types:19826} She generally feels {DESC; WELL/FAIRLY WELL/POORLY:18703}. She reports sleeping {DESC; WELL/FAIRLY WELL/POORLY:18703}. She {does/does not:200015} have additional problems to discuss today.    Most recent fall risk assessment:    02/22/2022    3:18 PM  [place] in the past year? 0  Number falls in past yr: 0  Injury with Fall? 0  Risk for fall due to : No Fall Risks  Follow up Falls evaluation completed     Most recent depression screenings:    02/22/2022    3:18 PM 04/10/2021    9:01 AM  PHQ 2/9 Scores  PHQ - 2 Score 0 0    {VISON DENTAL STD PSA (Optional):27386}  {History (Optional):23778}  Patient Care Team: Denita Lung, MD as PCP - General (Family Medicine) Denita Lung, MD (Family Medicine)   Outpatient Medications Prior to Visit  Medication Sig   brimonidine (ALPHAGAN) 0.2 % ophthalmic solution SMARTSIG:In Eye(s)   diclofenac (VOLTAREN) 75 MG EC tablet Take 1 tablet (75 mg total) by mouth 2 (two) times daily.   doxycycline (VIBRA-TABS) 100 MG tablet Take 1 tablet (100 mg total) by mouth 2 (two) times daily.   doxycycline (VIBRAMYCIN) 100 MG capsule Take 100 mg by mouth daily. (Patient not taking: Reported on 04/10/2021)   Latanoprostene Bunod (VYZULTA) 0.024 % SOLN    methocarbamol (ROBAXIN) 500 MG tablet Take 2 tablets (1,000 mg total) by mouth at bedtime as needed for muscle spasms.   No facility-administered medications prior to visit.    ROS        Objective:     There were no vitals taken for this visit. {Vitals History (Optional):23777}  Physical Exam   No results found for any visits on 06/25/22. {Show previous labs (optional):23779}     Assessment & Plan:    Routine Health Maintenance and Physical Exam  Immunization History  Administered Date(s) Administered   Influenza,inj,Quad PF,6+ Mos 10/19/2021    Health Maintenance  Topic Date Due   COVID-19 Vaccine (1) Never done   HIV Screening  Never done   Hepatitis C Screening  Never done   TETANUS/TDAP  Never done   Zoster Vaccines- Shingrix (1 of 2) Never done   COLONOSCOPY (Pts 45-32yr Insurance coverage will need to be confirmed)  Never done   PAP SMEAR-Modifier  01/03/2015   MAMMOGRAM  Never done   INFLUENZA VACCINE  07/03/2022   HPV VACCINES  Aged Out    Discussed health benefits of physical activity, and encouraged her to engage in regular exercise appropriate for her age and condition.  Problem List Items Addressed This Visit       Musculoskeletal and Integument   Hemangioma of skin and subcutaneous tissue   Rosacea     Other   Body mass index (BMI) 27.0-27.9, adult - Primary   No follow-ups on file.     KElyse Jarvis RMA

## 2022-08-08 ENCOUNTER — Encounter: Payer: Self-pay | Admitting: Internal Medicine

## 2022-08-16 DIAGNOSIS — H5213 Myopia, bilateral: Secondary | ICD-10-CM | POA: Diagnosis not present

## 2022-09-11 ENCOUNTER — Encounter: Payer: Self-pay | Admitting: Internal Medicine

## 2022-09-24 ENCOUNTER — Encounter: Payer: Self-pay | Admitting: Internal Medicine

## 2022-10-24 ENCOUNTER — Encounter (HOSPITAL_BASED_OUTPATIENT_CLINIC_OR_DEPARTMENT_OTHER): Payer: Self-pay | Admitting: Emergency Medicine

## 2022-10-24 ENCOUNTER — Other Ambulatory Visit: Payer: Self-pay

## 2022-10-24 ENCOUNTER — Emergency Department (HOSPITAL_BASED_OUTPATIENT_CLINIC_OR_DEPARTMENT_OTHER): Payer: BC Managed Care – PPO | Admitting: Radiology

## 2022-10-24 ENCOUNTER — Emergency Department (HOSPITAL_BASED_OUTPATIENT_CLINIC_OR_DEPARTMENT_OTHER)
Admission: EM | Admit: 2022-10-24 | Discharge: 2022-10-24 | Disposition: A | Discharge status: Home / Self Care | Payer: BC Managed Care – PPO | Attending: Emergency Medicine | Admitting: Emergency Medicine

## 2022-10-24 DIAGNOSIS — R0781 Pleurodynia: Secondary | ICD-10-CM | POA: Insufficient documentation

## 2022-10-24 DIAGNOSIS — R0789 Other chest pain: Secondary | ICD-10-CM | POA: Diagnosis not present

## 2022-10-24 DIAGNOSIS — Y9301 Activity, walking, marching and hiking: Secondary | ICD-10-CM | POA: Insufficient documentation

## 2022-10-24 DIAGNOSIS — W109XXA Fall (on) (from) unspecified stairs and steps, initial encounter: Secondary | ICD-10-CM | POA: Insufficient documentation

## 2022-10-24 DIAGNOSIS — W19XXXA Unspecified fall, initial encounter: Secondary | ICD-10-CM

## 2022-10-24 MED ORDER — LIDOCAINE 5 % EX PTCH
1.0000 | MEDICATED_PATCH | CUTANEOUS | 0 refills | Status: AC
Start: 2022-10-24 — End: ?

## 2022-10-24 MED ORDER — CYCLOBENZAPRINE HCL 10 MG PO TABS: 10.0000 mg | ORAL_TABLET | Freq: Two times a day (BID) | ORAL | 0 refills | Status: DC | PRN

## 2022-10-24 MED ORDER — LIDOCAINE 5 % EX PTCH
1.0000 | MEDICATED_PATCH | Freq: Once | CUTANEOUS | Status: DC
Start: 2022-10-24 — End: 2022-10-24
  Administered 2022-10-24: 1 via TRANSDERMAL
  Filled 2022-10-24: qty 1

## 2022-10-24 NOTE — Discharge Instructions (Addendum)
As we discussed, your workup in the ER today was reassuring for acute findings.  X-ray imaging of your ribs did not reveal any emergent concerns.  As we discussed, this does not definitively rule out a small rib fracture, however management of that is rest and pain control. I have given you a prescription for lidocaine patches and Flexeril which is a muscle relaxer for you to take as prescribed as needed for management of your pain.  Do not drive or operate heavy machinery after taking Flexeril as it can be sedating.  I recommend you use this specifically to help sleep at night.  Follow-up with your primary care doctor as needed for any additional health needs.  Return if development of any new or worsening symptoms.

## 2022-10-24 NOTE — ED Provider Notes (Signed)
[facility] EMERGENCY DEPT Provider Note   CSN: [id number] Arrival date & time: 10/24/22  1312     History  Chief Complaint  Patient presents with   Nancy Quinn    Nancy Quinn is a 56 y.o. female.  Patient with noncontributory past medical history presents today with complaints of fall.  She states that same occurred last Thursday when she was walking up the steps and missed a step.  States that she subsequently fell down several stairs, struck her head and lost consciousness.  States that at that time EMS was called to the scene and evaluated her and she refused transport at that time. She is not anticoagulated. States that since the event she had been having right sided rib pain that has been persistent since with associated pain with breathing. Denies any headache, vision changes, dizziness, lightheadedness, nausea, vomiting. Denies any shortness of breath, fevers, or chills.  The history is provided by the patient. No language interpreter was used.  Fall       Home Medications Prior to Admission medications   Medication Sig Start Date End Date Taking? Authorizing Provider  brimonidine (ALPHAGAN) 0.2 % ophthalmic solution SMARTSIG:In Eye(s) 03/30/21   [provider]  diclofenac (VOLTAREN) 75 MG EC tablet Take 1 tablet (75 mg total) by mouth 2 (two) times daily. 02/22/22   Irene Pap, PA-C  doxycycline (VIBRA-TABS) 100 MG tablet Take 1 tablet (100 mg total) by mouth 2 (two) times daily. 01/04/20   Henson, Vickie L, NP-C  doxycycline (VIBRAMYCIN) 100 MG capsule Take 100 mg by mouth daily. Patient not taking: Reported on 04/10/2021 03/20/21   [provider]  Latanoprostene Bunod (VYZULTA) 0.024 % SOLN     [provider]  methocarbamol (ROBAXIN) 500 MG tablet Take 2 tablets (1,000 mg total) by mouth at bedtime as needed for muscle spasms. 02/22/22   Irene Pap, PA-C      Allergies    Penicillins    Review of Systems   Review of  Systems  All other systems reviewed and are negative.   Physical Exam Updated Vital Signs BP (!) 166/105 (BP Location: Left Arm)   Pulse 74   Temp 99.1 F (37.3 C) (Oral)   Resp 16   Ht '5\' 3"'$  (1.6 m)   Wt 65.8 kg   SpO2 100%   BMI 25.69 kg/m  Physical Exam Vitals and nursing note reviewed.  Constitutional:      General: She is not in acute distress.    Appearance: Normal appearance. She is normal weight. She is not ill-appearing, toxic-appearing or diaphoretic.  HENT:     Head: Normocephalic and atraumatic.  Eyes:     Extraocular Movements: Extraocular movements intact.     Pupils: Pupils are equal, round, and reactive to light.  Cardiovascular:     Rate and Rhythm: Normal rate.  Pulmonary:     Effort: Pulmonary effort is normal. No respiratory distress.     Breath sounds: Normal breath sounds.     Comments: Tenderness noted to palpation of the right posterior ribcage. No crepitus, bruising, or overlying skin changes.  Abdominal:     General: Abdomen is flat.     Palpations: Abdomen is soft.  Musculoskeletal:        General: Normal range of motion.     Cervical back: Normal range of motion.     Comments: No tenderness to palpation of cervical, thoracic, or lumbar spine.   Patient ambulatory to triage with steady  gait.  Skin:    General: Skin is warm and dry.  Neurological:     General: No focal deficit present.     Mental Status: She is alert and oriented to person, place, and time.  Psychiatric:        Mood and Affect: Mood normal.        Behavior: Behavior normal.     ED Results / Procedures / Treatments   Labs (all labs ordered are listed, but only abnormal results are displayed) Labs Reviewed - No data to display  EKG None  Radiology DG Ribs Unilateral W/Chest Right  Result Date: 10/24/2022 CLINICAL DATA:  Chest pain after fall. EXAM: RIGHT RIBS AND CHEST - 3+ VIEW COMPARISON:  None Available. FINDINGS: No fracture or other bone lesions are seen  involving the ribs. There is no evidence of pneumothorax or pleural effusion. Both lungs are clear. Heart size and mediastinal contours are within normal limits. IMPRESSION: Negative. Electronically Signed   By: Marijo Conception M.D.   On: 10/24/2022 14:25    Procedures Procedures    Medications Ordered in ED Medications  lidocaine (LIDODERM) 5 % 1 patch (1 patch Transdermal Patch Applied 10/24/22 1509)    ED Course/ Medical Decision Making/ A&P                           Medical Decision Making Amount and/or Complexity of Data Reviewed Radiology: ordered.   Patient presents today with complaints of fall x 6 days ago. She is afebrile, non-toxic appearing, and in no acute distress with reassuring vital signs. Physical exam reveals tenderness to palpation of the right posterior ribcage without bruising or crepitus. No other injuries or complaints. Will obtain x-ray and reassess.  X-ray imaging reveals no acute findings  I have personally reviewed and interpreted this imaging and agree with radiology interpretation.  Patient given lidocaine patch with improvement in her symptoms.  I have discussed with her the findings on x-ray and informed her that she could potentially have a rib fracture not seen on the x-ray, however there is no indication for CT imaging at this time given her improvement with lidocaine patch. Also given time since the fall, no concern for missed pulmonary contusion or intra-thoracic bleeding or any other emergent concerns at this time. Will send with prescription for lidocaine patches and Flexeril to help with her symptoms that are most prominent at night.  Patient is understanding and amenable with plan.  Emphasized importance of close PCP follow-up.  Also educated on red flag symptoms that would prompt immediate return.  Patient discharged in stable condition.  Findings and plan of care discussed with supervising physician Dr. Jeanell Sparrow who is in agreement.    Final  Clinical Impression(s) / ED Diagnoses Final diagnoses:  Fall, initial encounter    Rx / DC Orders ED Discharge Orders          Ordered    cyclobenzaprine (FLEXERIL) 10 MG tablet  2 times daily PRN        10/24/22 1517    lidocaine (LIDODERM) 5 %  Every 24 hours        10/24/22 1517          An After Visit Summary was printed and given to the patient.     Nestor Lewandowsky 10/24/22 1520    Pattricia Boss, MD 10/26/22 1459

## 2022-10-24 NOTE — ED Triage Notes (Signed)
Was going up stairs, missed a step and fell down the stairs. C/o R chest pain and rib pain. Pain increases with inspiration. +LOC

## 2022-10-30 ENCOUNTER — Telehealth: Payer: Self-pay | Admitting: Family Medicine

## 2022-10-30 NOTE — Telephone Encounter (Signed)
Transition Care Management Follow-up Telephone Call Date of discharge and from where: 10/24/2022 [facility] How have you been since you were released from the hospital? Getting better Any questions or concerns? No  Items Reviewed: Did the pt receive and understand the discharge instructions provided? Yes  Medications obtained and verified? Yes insurance denied but doing ok on otc pain meds Other? No  Any new allergies since your discharge? No  Dietary orders reviewed? No Do you have support at home? Yes   Home Care and Equipment/Supplies: Were home health services ordered? not applicable   Follow up appointments reviewed:    Pt declined follow up  worsens, is the pt aware to call PCP or go to the Emergency Dept.? Yes Was the patient provided with contact information for the PCP's office or ED? Yes Was to pt encouraged to call back with questions or concerns? Yes

## 2023-01-17 DIAGNOSIS — M5136 Other intervertebral disc degeneration, lumbar region: Secondary | ICD-10-CM | POA: Diagnosis not present

## 2023-01-17 DIAGNOSIS — M9903 Segmental and somatic dysfunction of lumbar region: Secondary | ICD-10-CM | POA: Diagnosis not present

## 2023-01-21 DIAGNOSIS — M5136 Other intervertebral disc degeneration, lumbar region: Secondary | ICD-10-CM | POA: Diagnosis not present

## 2023-01-21 DIAGNOSIS — M9903 Segmental and somatic dysfunction of lumbar region: Secondary | ICD-10-CM | POA: Diagnosis not present

## 2023-01-24 DIAGNOSIS — M9903 Segmental and somatic dysfunction of lumbar region: Secondary | ICD-10-CM | POA: Diagnosis not present

## 2023-01-24 DIAGNOSIS — M5136 Other intervertebral disc degeneration, lumbar region: Secondary | ICD-10-CM | POA: Diagnosis not present

## 2023-02-01 ENCOUNTER — Telehealth: Payer: Self-pay | Admitting: Family Medicine

## 2023-02-01 ENCOUNTER — Telehealth: Payer: BC Managed Care – PPO | Admitting: Physician Assistant

## 2023-02-01 DIAGNOSIS — M5442 Lumbago with sciatica, left side: Secondary | ICD-10-CM

## 2023-02-01 MED ORDER — CYCLOBENZAPRINE HCL 10 MG PO TABS: 5.0000 mg | ORAL_TABLET | Freq: Three times a day (TID) | ORAL | 0 refills | Status: AC | PRN

## 2023-02-01 MED ORDER — METHYLPREDNISOLONE 4 MG PO TBPK: ORAL_TABLET | ORAL | 0 refills | Status: AC

## 2023-02-01 MED ORDER — DICLOFENAC SODIUM 75 MG PO TBEC
75.0000 mg | DELAYED_RELEASE_TABLET | Freq: Two times a day (BID) | ORAL | 0 refills | Status: DC
Start: 2023-02-01 — End: 2023-02-15

## 2023-02-01 NOTE — Telephone Encounter (Signed)
Pt has an appointment with you on Monday for leg pain and she asks if you can call something in for her for the weekend to help with the pain.

## 2023-02-01 NOTE — Patient Instructions (Signed)
Nancy Quinn, thank you for joining Mar Daring, PA-C for today's virtual visit.  While this provider is not your primary care provider (PCP), if your PCP is located in our provider database this encounter information will be shared with them immediately following your visit.   [facility] account gives you access to today's visit and all your visits, tests, and labs performed at [facility] " click here if you don't have a [facility] account or go to [email or web address]  Consent: (Patient) Nancy Quinn provided verbal consent for this virtual visit at the beginning of the encounter.  Current Medications:  Current Outpatient Medications:    cyclobenzaprine (FLEXERIL) 10 MG tablet, Take 0.5-1 tablets (5-10 mg total) by mouth 3 (three) times daily as needed., Disp: 30 tablet, Rfl: 0   methylPREDNISolone (MEDROL DOSEPAK) 4 MG TBPK tablet, 6 day taper; take as directed on package instructions, Disp: 21 tablet, Rfl: 0   brimonidine (ALPHAGAN) 0.2 % ophthalmic solution, SMARTSIG:In Eye(s), Disp: , Rfl:    diclofenac (VOLTAREN) 75 MG EC tablet, Take 1 tablet (75 mg total) by mouth 2 (two) times daily., Disp: 30 tablet, Rfl: 0   doxycycline (VIBRA-TABS) 100 MG tablet, Take 1 tablet (100 mg total) by mouth 2 (two) times daily., Disp: 20 tablet, Rfl: 0   doxycycline (VIBRAMYCIN) 100 MG capsule, Take 100 mg by mouth daily. (Patient not taking: Reported on 04/10/2021), Disp: , Rfl:    Latanoprostene Bunod (VYZULTA) 0.024 % SOLN, , Disp: , Rfl:    lidocaine (LIDODERM) 5 %, Place 1 patch onto the skin daily. Remove & Discard patch within 12 hours or as directed by MD, Disp: 60 each, Rfl: 0   Medications ordered in this encounter:  Meds ordered this encounter  Medications   methylPREDNISolone (MEDROL DOSEPAK) 4 MG TBPK tablet    Sig: 6 day taper; take as directed on package instructions    Dispense:  21 tablet    Refill:  0    Order Specific Question:    Supervising Provider    Answer:   Chase Picket [id number]   cyclobenzaprine (FLEXERIL) 10 MG tablet    Sig: Take 0.5-1 tablets (5-10 mg total) by mouth 3 (three) times daily as needed.    Dispense:  30 tablet    Refill:  0    Order Specific Question:   Supervising Provider    Answer:   Chase Picket [id number]     *If you need refills on other medications prior to your next appointment, please contact your pharmacy*  Follow-Up: Call back or seek an in-person evaluation if the symptoms worsen or if the condition fails to improve as anticipated.  [facility] [phone]  Other Instructions  [facility] [address], [place], [place] [address] URGENT CARE HOURS Monday - Friday: 8:00am to 8:00pm Saturday: 10:00am to 3:00pm [id number]    If you have been instructed to have an in-person evaluation today at a local Urgent Care facility, please use the link below. It will take you to a list of all of our available [facility], including address, phone number and hours of operation. Please do not delay care.  [facility]  If you or a family member do not have a primary care provider, use the link below to schedule a visit and establish care. When you choose a [facility] primary care physician or advanced practice provider, you gain a long-term partner in health. Find  a Primary Care Provider  Learn more about [facility]'s in-office and virtual care options: [facility]

## 2023-02-01 NOTE — Progress Notes (Signed)
Virtual Visit Consent   Nancy Quinn, you are scheduled for a virtual visit with a [facility] provider today. Just as with appointments in the office, your consent must be obtained to participate. Your consent will be active for this visit and any virtual visit you may have with one of our providers in the next 365 days. If you have a MyChart account, a copy of this consent can be sent to you electronically.  As this is a virtual visit, video technology does not allow for your provider to perform a traditional examination. This may limit your provider's ability to fully assess your condition. If your provider identifies any concerns that need to be evaluated in person or the need to arrange testing (such as labs, EKG, etc.), we will make arrangements to do so. Although advances in technology are sophisticated, we cannot ensure that it will always work on either your end or our end. If the connection with a video visit is poor, the visit may have to be switched to a telephone visit. With either a video or telephone visit, we are not always able to ensure that we have a secure connection.  By engaging in this virtual visit, you consent to the provision of healthcare and authorize for your insurance to be billed (if applicable) for the services provided during this visit. Depending on your insurance coverage, you may receive a charge related to this service.  I need to obtain your verbal consent now. Are you willing to proceed with your visit today? Nancy Quinn has provided verbal consent on 02/01/2023 for a virtual visit (video or telephone). Mar Daring, PA-C  Date: 02/01/2023 5:49 PM  Virtual Visit via Video Note   I, Mar Daring, connected with  Nancy Quinn  (ID:[id number], [birth year]) on 02/01/23 at  5:30 PM EST by a video-enabled telemedicine application and verified that I am speaking with the correct person using two identifiers.  Location: Patient: Virtual Visit Location  Patient: Home Provider: Virtual Visit Location Provider: Home Office   I discussed the limitations of evaluation and management by telemedicine and the availability of in person appointments. The patient expressed understanding and agreed to proceed.    History of Present Illness: Nancy Quinn is a 57 y.o. who identifies as a female who was assigned female at birth, and is being seen today for back pain and sciatica.  HPI: Back Pain This is a new problem. Episode onset: injured back about a month ago; went to a chiropractor about 3 weeks ago.Told pelvis was out of alignment and lower on left. Had 3 treatments. Last Thursday had more pain than usual after treatment and has been progressively worsening. The problem occurs constantly. The problem has been gradually worsening since onset. The pain is present in the lumbar spine and gluteal. The quality of the pain is described as shooting, stabbing and aching. The pain radiates to the left knee, left thigh and left foot. The pain is severe. The pain is The same all the time. The symptoms are aggravated by lying down, twisting, standing, position and bending (cannot lay on back). Stiffness is present All day. Associated symptoms include leg pain, numbness and weakness. Pertinent negatives include no bladder incontinence, bowel incontinence, dysuria, fever or perianal numbness. Treatments tried: 2 ibuprofen and 2 acetaminophen.     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 exposure to nonionizing radiation 02/23/2022   Rosacea 02/23/2022   Body mass index (BMI) 27.0-27.9, adult 02/23/2022    Allergies:  Allergies  Allergen Reactions   Penicillins    Medications:  Current Outpatient Medications:    cyclobenzaprine (FLEXERIL) 10 MG tablet, Take 0.5-1 tablets (5-10 mg total)  by mouth 3 (three) times daily as needed., Disp: 30 tablet, Rfl: 0   methylPREDNISolone (MEDROL DOSEPAK) 4 MG TBPK tablet, 6 day taper; take as directed on package instructions, Disp: 21 tablet, Rfl: 0   brimonidine (ALPHAGAN) 0.2 % ophthalmic solution, SMARTSIG:In Eye(s), Disp: , Rfl:    diclofenac (VOLTAREN) 75 MG EC tablet, Take 1 tablet (75 mg total) by mouth 2 (two) times daily., Disp: 30 tablet, Rfl: 0   doxycycline (VIBRA-TABS) 100 MG tablet, Take 1 tablet (100 mg total) by mouth 2 (two) times daily., Disp: 20 tablet, Rfl: 0   doxycycline (VIBRAMYCIN) 100 MG capsule, Take 100 mg by mouth daily. (Patient not taking: Reported on 04/10/2021), Disp: , Rfl:    Latanoprostene Bunod (VYZULTA) 0.024 % SOLN, , Disp: , Rfl:    lidocaine (LIDODERM) 5 %, Place 1 patch onto the skin daily. Remove & Discard patch within 12 hours or as directed by MD, Disp: 60 each, Rfl: 0  Observations/Objective: Patient is well-developed, well-nourished in no acute distress.  Resting comfortably at home.  Head is normocephalic, atraumatic.  No labored breathing.  Speech is clear and coherent with logical content.  Patient is alert and oriented at baseline.    Assessment and Plan: 1. Acute midline low back pain with left-sided sciatica - methylPREDNISolone (MEDROL DOSEPAK) 4 MG TBPK tablet; 6 day taper; take as directed on package instructions  Dispense: 21 tablet; Refill: 0 - cyclobenzaprine (FLEXERIL) 10 MG tablet; Take 0.5-1 tablets (5-10 mg total) by mouth 3 (three) times daily as needed.  Dispense: 30 tablet; Refill: 0  - Suspect herniated disc with radiculopathy possible vs muscle spasm with sciatica - Failed NSAIDs - Add Medrol dose pack and flexeril - Avoid NSAIDs (Ibuprofen/Advil/Motrin or Naproxen/Aleve) while on steroid - Tylenol if okay for breakthrough pain - Heat to area - Epsom salt soak if able to get in and out of bath tub safely - Back exercises and stretches provided via AVS - Seek in person  evaluation if worsening or fails to improve with treatment   Follow Up Instructions: I discussed the assessment and treatment plan with the patient. The patient was provided an opportunity to ask questions and all were answered. The patient agreed with the plan and demonstrated an understanding of the instructions.  A copy of instructions were sent to the patient via MyChart unless otherwise noted below.    The patient was advised to call back or seek an in-person evaluation if the symptoms worsen or if the condition fails to improve as anticipated.  Time:  I spent 15 minutes with the patient via telehealth technology discussing the above problems/concerns.    Mar Daring, PA-C

## 2023-02-04 ENCOUNTER — Ambulatory Visit: Payer: BC Managed Care – PPO | Admitting: Medical

## 2023-02-04 VITALS — BP 136/92 | HR 90 | Ht 60.0 in | Wt 147.8 lb

## 2023-02-04 DIAGNOSIS — R29898 Other symptoms and signs involving the musculoskeletal system: Secondary | ICD-10-CM

## 2023-02-04 DIAGNOSIS — M5442 Lumbago with sciatica, left side: Secondary | ICD-10-CM | POA: Diagnosis not present

## 2023-02-04 DIAGNOSIS — R200 Anesthesia of skin: Secondary | ICD-10-CM | POA: Diagnosis not present

## 2023-02-04 DIAGNOSIS — M545 Low back pain, unspecified: Secondary | ICD-10-CM

## 2023-02-04 NOTE — Progress Notes (Signed)
Subjective:  Nancy Quinn is a 57 y.o. female who presents for Chief Complaint  Patient presents with   Leg Pain    Left leg pain, numbness with tingling   Back Pain    Back pain for 4 days, back feels "out of alignment", numbness     Here for back pain, leg pain.  Was out of town last week.  When they got to their destination last week, pain was unbearable , could only lie on stomach. Couldn't like on any other position.   This past Friday morning did televisit, and was prescribed steroid taper and flexeril, which has been using since 3 days ago.   She denies history of chronic back pains . About 3-4 weeks ago was reaching and felt like pressure  in low back, pressure if siting in soft chair.    Went to chiropractor few weeks ago for same, had xrays that reportedly showed narrow disc space in lower lumbar discs and pelvis rotated.  Had 3 sessions over the next week, and had pain after chirorapctor therapy sessions as well.  Current pain is mid to left low back, pain around left inguinal region, numbness along left latera thigh and rotating into inner thigh.    No fever, no incontinence, no saddle anesthia.     Had a fall in 11/2022.  Fell from her stair landing in her house to the flow.  Was knocked unconscious, husband found her, but her Apple watch alarmed EMS.  She was seen and evaluated in the emergency dept for this.  No other aggravating or relieving factors.    No other c/o.  No past medical history on file.  Current Outpatient Medications on File Prior to Visit  Medication Sig Dispense Refill   brimonidine (ALPHAGAN) 0.2 % ophthalmic solution SMARTSIG:In Eye(s)     cyclobenzaprine (FLEXERIL) 10 MG tablet Take 0.5-1 tablets (5-10 mg total) by mouth 3 (three) times daily as needed. 30 tablet 0   Latanoprostene Bunod (VYZULTA) 0.024 % SOLN      methylPREDNISolone (MEDROL DOSEPAK) 4 MG TBPK tablet 6 day taper; take as directed on package instructions 21 tablet 0   diclofenac  (VOLTAREN) 75 MG EC tablet Take 1 tablet (75 mg total) by mouth 2 (two) times daily. (Patient not taking: Reported on 02/04/2023) 30 tablet 0   doxycycline (VIBRA-TABS) 100 MG tablet Take 1 tablet (100 mg total) by mouth 2 (two) times daily. (Patient not taking: Reported on 02/04/2023) 20 tablet 0   doxycycline (VIBRAMYCIN) 100 MG capsule Take 100 mg by mouth daily. (Patient not taking: Reported on 02/04/2023)     lidocaine (LIDODERM) 5 % Place 1 patch onto the skin daily. Remove & Discard patch within 12 hours or as directed by MD (Patient not taking: Reported on 02/04/2023) 60 each 0   No current facility-administered medications on file prior to visit.     The following portions of the patient's history were reviewed and updated as appropriate: allergies, current medications, past family history, past medical history, past social history, past surgical history and problem list.  ROS Other   Objective: BP (!) 136/92   Pulse 90   Ht 5' (1.524 m)   Wt 147 lb 12.8 oz (67 kg)   BMI 28.87 kg/m   General appearance: alert, no distress, well developed, well nourished Or bruising or erythema Back: Tender left lumbar paraspinal, tender lumbar midline spinal, tender somewhat over superior buttock on the left, but not tender over the sciatic notch,  rest of back nontender, range of motion about 90% of normal with flexion and about 80% of normal extension but does have pain with both flexion extension and rotation causes pain as well MSK: She does have some tenderness in general along the left lateral thigh, tender over the anterior superior iliac spine, but nontender over the left greater trochanter bursa and otherwise left leg and legs nontender in general, hip range of motion seems to be with normal There seems to be some weakness in the left leg in general compared to the right, still 4-5 out of 5 but certainly an asymmetry  difference in strength on the left leg being weaker than the right throughout,  DTRs and sensation normal both legs Pulses: 2+ radial pulses, 2+ pedal pulses, normal cap refill Ext: no edema    Assessment: Encounter Diagnoses  Name Primary?   Acute left-sided low back pain with left-sided sciatica Yes   Lumbar spine pain    Left leg numbness    Left leg weakness      Plan: Given her left leg numbness, low back pain, somewhat rapid onset of numbness, referral for MRI.  I do not have the chiropractor records from where she saw them recently but reportedly does decreased disc base in the lower lumbar region.  We discussed the possibility of bulging disc, ruptured disc, radicular issues or other..  Of note she had 3 recent chiropractic visits for the same pains but all 3 times had worsening of pain after treatment  Shalu was seen today for leg pain and back pain.  Diagnoses and all orders for this visit:  Acute left-sided low back pain with left-sided sciatica -     MR Lumbar Spine Wo Contrast; Future  Lumbar spine pain -     MR Lumbar Spine Wo Contrast; Future  Left leg numbness -     MR Lumbar Spine Wo Contrast; Future  Left leg weakness -     MR Lumbar Spine Wo Contrast; Future    Follow up: pending imaging

## 2023-02-12 ENCOUNTER — Other Ambulatory Visit: Payer: Self-pay | Admitting: Physician Assistant

## 2023-02-12 DIAGNOSIS — H40023 Open angle with borderline findings, high risk, bilateral: Secondary | ICD-10-CM | POA: Diagnosis not present

## 2023-02-12 DIAGNOSIS — M5442 Lumbago with sciatica, left side: Secondary | ICD-10-CM

## 2023-02-14 ENCOUNTER — Telehealth: Payer: Self-pay | Admitting: Family Medicine

## 2023-02-14 NOTE — Telephone Encounter (Signed)
Pt called and states that she is scheduled to have a MRI tomorrow. She states that for the last two days she has not had to take any pain medication, the pressure in her lower back is less and the numbness in her leg is less. She wants to know if she can put the MRI off for about 2 weeks to see if she continues to improve. Please advise pt at [phone]. Sending to Audelia Acton and Gabriel Cirri as Audelia Acton is leaving early today and time is an issue.

## 2023-02-14 NOTE — Telephone Encounter (Signed)
Pt was notified.  

## 2023-02-15 ENCOUNTER — Other Ambulatory Visit: Payer: Self-pay | Admitting: Family Medicine

## 2023-02-15 ENCOUNTER — Telehealth: Payer: Self-pay | Admitting: Internal Medicine

## 2023-02-15 ENCOUNTER — Other Ambulatory Visit: Payer: Self-pay | Admitting: Medical

## 2023-02-15 ENCOUNTER — Ambulatory Visit (HOSPITAL_COMMUNITY): Payer: BC Managed Care – PPO

## 2023-02-15 MED ORDER — NAPROXEN 500 MG PO TABS: 500.0000 mg | ORAL_TABLET | Freq: Two times a day (BID) | ORAL | 0 refills | Status: AC

## 2023-02-15 MED ORDER — TRAMADOL HCL 50 MG PO TABS: 50.0000 mg | ORAL_TABLET | Freq: Two times a day (BID) | ORAL | 0 refills | Status: DC | PRN

## 2023-02-15 MED ORDER — TRAMADOL HCL 50 MG PO TABS: 50.0000 mg | ORAL_TABLET | Freq: Two times a day (BID) | ORAL | 0 refills | Status: AC | PRN

## 2023-02-15 NOTE — Telephone Encounter (Signed)
Pt called and states that her insurance is requiring a PA for Tramadol. Please handle PA for this

## 2023-02-15 NOTE — Telephone Encounter (Signed)
Pt husband just called and pt is in severe pain and her insurance is requiring a PA to be done on Tramadol. I advised that we did not have a provider in the office as we are closing for the day and no one could write anythign for pain at this time. Pt's got mad as I did not know the after call number and hung up.   Pt cancelled her imaging yesterday and after she cancelled her pain came back and flared worse.

## 2023-02-15 NOTE — Telephone Encounter (Signed)
Pt has taken aleve last night and took 2 this morning and nothing is working. She doesn't think naproxen is going to work if it is like [facility]

## 2023-02-15 NOTE — Telephone Encounter (Signed)
Pt was notified.  

## 2023-02-15 NOTE — Telephone Encounter (Addendum)
Pt called and postponed her imaging but called today and said it flared back up yesterday and her next appt with imaging is April 5. Would like antinflammatory to help. The Diclofenac has not helped at all

## 2023-02-19 NOTE — Telephone Encounter (Signed)
I have contacted patient and left voicemail that she call us to see how she is doing   Pt did pick up tramadol 4 days ago up at [facility] [place]

## 2023-03-08 ENCOUNTER — Ambulatory Visit (HOSPITAL_COMMUNITY)
Admission: RE | Admit: 2023-03-08 | Discharge: 2023-03-08 | Disposition: A | Discharge status: Home / Self Care | Payer: BC Managed Care – PPO | Source: Ambulatory Visit | Attending: Medical | Admitting: Medical

## 2023-03-08 DIAGNOSIS — M5442 Lumbago with sciatica, left side: Secondary | ICD-10-CM | POA: Insufficient documentation

## 2023-03-08 DIAGNOSIS — M545 Low back pain, unspecified: Secondary | ICD-10-CM | POA: Insufficient documentation

## 2023-03-08 DIAGNOSIS — R200 Anesthesia of skin: Secondary | ICD-10-CM | POA: Insufficient documentation

## 2023-03-08 DIAGNOSIS — R29898 Other symptoms and signs involving the musculoskeletal system: Secondary | ICD-10-CM | POA: Diagnosis not present

## 2023-03-12 DIAGNOSIS — M4316 Spondylolisthesis, lumbar region: Secondary | ICD-10-CM | POA: Insufficient documentation

## 2023-03-12 DIAGNOSIS — M5416 Radiculopathy, lumbar region: Secondary | ICD-10-CM | POA: Diagnosis not present

## 2023-03-12 DIAGNOSIS — Z6826 Body mass index (BMI) 26.0-26.9, adult: Secondary | ICD-10-CM | POA: Diagnosis not present

## 2023-03-12 NOTE — Progress Notes (Signed)
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  There is narrowing around the nerve roots as well in the lumbar spine.  Given these findings I recommend referral to a specialist.  What is her pain been like the last week?  I would like to use Dr. Yevette Edwards at [facility] orthopedics.  If she has some other spine surgeon or back specialist she would rather see for consult then let me know.  Certainly can use stretching, but I would limit any type of jumping or hard impact exercise for now.

## 2023-05-15 DIAGNOSIS — Z0000 Encounter for general adult medical examination without abnormal findings: Secondary | ICD-10-CM | POA: Diagnosis not present

## 2023-05-15 DIAGNOSIS — Z124 Encounter for screening for malignant neoplasm of cervix: Secondary | ICD-10-CM | POA: Diagnosis not present

## 2023-05-15 DIAGNOSIS — M25551 Pain in right hip: Secondary | ICD-10-CM | POA: Diagnosis not present

## 2023-05-15 DIAGNOSIS — Z1211 Encounter for screening for malignant neoplasm of colon: Secondary | ICD-10-CM | POA: Diagnosis not present

## 2023-05-15 DIAGNOSIS — G8929 Other chronic pain: Secondary | ICD-10-CM | POA: Diagnosis not present

## 2023-05-15 DIAGNOSIS — Z1330 Encounter for screening examination for mental health and behavioral disorders, unspecified: Secondary | ICD-10-CM | POA: Diagnosis not present

## 2023-05-23 DIAGNOSIS — Z13220 Encounter for screening for lipoid disorders: Secondary | ICD-10-CM | POA: Diagnosis not present

## 2023-05-23 DIAGNOSIS — G8929 Other chronic pain: Secondary | ICD-10-CM | POA: Diagnosis not present

## 2023-05-23 DIAGNOSIS — N951 Menopausal and female climacteric states: Secondary | ICD-10-CM | POA: Diagnosis not present

## 2023-05-23 DIAGNOSIS — M25551 Pain in right hip: Secondary | ICD-10-CM | POA: Diagnosis not present

## 2023-05-23 DIAGNOSIS — Z130 Encounter for screening for diseases of the blood and blood-forming organs and certain disorders involving the immune mechanism: Secondary | ICD-10-CM | POA: Diagnosis not present

## 2023-05-28 DIAGNOSIS — M79672 Pain in left foot: Secondary | ICD-10-CM | POA: Diagnosis not present

## 2023-05-28 DIAGNOSIS — M19072 Primary osteoarthritis, left ankle and foot: Secondary | ICD-10-CM | POA: Diagnosis not present

## 2023-05-28 DIAGNOSIS — M25551 Pain in right hip: Secondary | ICD-10-CM | POA: Diagnosis not present

## 2023-05-28 DIAGNOSIS — S93602A Unspecified sprain of left foot, initial encounter: Secondary | ICD-10-CM | POA: Diagnosis not present

## 2023-05-28 DIAGNOSIS — G8929 Other chronic pain: Secondary | ICD-10-CM | POA: Diagnosis not present

## 2023-08-20 DIAGNOSIS — H5213 Myopia, bilateral: Secondary | ICD-10-CM | POA: Diagnosis not present

## 2023-10-18 DIAGNOSIS — J9801 Acute bronchospasm: Secondary | ICD-10-CM | POA: Diagnosis not present

## 2023-10-18 DIAGNOSIS — B9689 Other specified bacterial agents as the cause of diseases classified elsewhere: Secondary | ICD-10-CM | POA: Diagnosis not present

## 2023-10-18 DIAGNOSIS — J019 Acute sinusitis, unspecified: Secondary | ICD-10-CM | POA: Diagnosis not present

## 2023-10-30 DIAGNOSIS — S30851A Superficial foreign body of abdominal wall, initial encounter: Secondary | ICD-10-CM | POA: Diagnosis not present

## 2023-10-30 DIAGNOSIS — S30861A Insect bite (nonvenomous) of abdominal wall, initial encounter: Secondary | ICD-10-CM | POA: Diagnosis not present

## 2023-10-30 DIAGNOSIS — W57XXXA Bitten or stung by nonvenomous insect and other nonvenomous arthropods, initial encounter: Secondary | ICD-10-CM | POA: Diagnosis not present

## 2024-01-28 ENCOUNTER — Encounter: Payer: Self-pay | Admitting: Internal Medicine

## 2024-10-16 ENCOUNTER — Emergency Department (HOSPITAL_BASED_OUTPATIENT_CLINIC_OR_DEPARTMENT_OTHER): Admitting: Radiology

## 2024-10-16 ENCOUNTER — Encounter (HOSPITAL_BASED_OUTPATIENT_CLINIC_OR_DEPARTMENT_OTHER): Payer: Self-pay

## 2024-10-16 ENCOUNTER — Emergency Department (HOSPITAL_BASED_OUTPATIENT_CLINIC_OR_DEPARTMENT_OTHER)
Admission: EM | Admit: 2024-10-16 | Discharge: 2024-10-16 | Disposition: A | Discharge status: Home / Self Care | Attending: Emergency Medicine | Admitting: Emergency Medicine

## 2024-10-16 ENCOUNTER — Other Ambulatory Visit: Payer: Self-pay

## 2024-10-16 DIAGNOSIS — R079 Chest pain, unspecified: Secondary | ICD-10-CM

## 2024-10-16 DIAGNOSIS — R0789 Other chest pain: Secondary | ICD-10-CM | POA: Diagnosis present

## 2024-10-16 LAB — BASIC METABOLIC PANEL WITH GFR
Anion gap: 12 (ref 5–15)
BUN: 7 mg/dL (ref 6–20)
CO2: 27 mmol/L (ref 22–32)
Calcium: 10.4 mg/dL — ABNORMAL HIGH (ref 8.9–10.3)
Chloride: 101 mmol/L (ref 98–111)
Creatinine, Ser: 0.79 mg/dL (ref 0.44–1.00)
GFR, Estimated: >60 mL/min (ref >60)
Glucose, Bld: 110 mg/dL — ABNORMAL HIGH (ref 70–99)
Potassium: 3.7 mmol/L (ref 3.5–5.1)
Sodium: 139 mmol/L (ref 135–145)

## 2024-10-16 LAB — CBC
HCT: 41.4 % (ref 36.0–46.0)
Hemoglobin: 14 g/dL (ref 12.0–15.0)
MCH: 31.3 pg (ref 26.0–34.0)
MCHC: 33.8 g/dL (ref 30.0–36.0)
MCV: 92.6 fL (ref 80.0–100.0)
Platelets: 325 K/uL (ref 150–400)
RBC: 4.47 MIL/uL (ref 3.87–5.11)
RDW: 12.3 % (ref 11.5–15.5)
WBC: 8 K/uL (ref 4.0–10.5)
nRBC: 0 % (ref 0.0–0.2)

## 2024-10-16 LAB — TROPONIN T, HIGH SENSITIVITY
Troponin T High Sensitivity: <15 ng/L (ref 0–19)
Troponin T High Sensitivity: <15 ng/L (ref 0–19)

## 2024-10-16 LAB — D-DIMER, QUANTITATIVE: D-Dimer, Quant: 0.48 ug{FEU}/mL (ref 0.00–0.50)

## 2024-10-16 MED ORDER — ACETAMINOPHEN 325 MG PO TABS
650.0000 mg | ORAL_TABLET | Freq: Once | ORAL | Status: AC
  Administered 2024-10-16: 650 mg via ORAL
  Filled 2024-10-16: qty 2

## 2024-10-16 MED ORDER — FAMOTIDINE 20 MG PO TABS
20.0000 mg | ORAL_TABLET | Freq: Once | ORAL | Status: AC
  Administered 2024-10-16: 20 mg via ORAL
  Filled 2024-10-16: qty 1

## 2024-10-16 NOTE — ED Triage Notes (Signed)
 Patient reports chest pain for one week however last night she says it kept her up all night. She says it is left sided with no radiation. She says it feels electrical. When she lays on her left side she feels more discomfort. Reports some nausea, but no shortness of breath.

## 2024-10-16 NOTE — ED Provider Notes (Signed)
 [place] EMERGENCY DEPARTMENT AT [facility] Provider Note   CSN: [id number] Arrival date & time: 10/16/24  1423     Patient presents with: Chest Pain   Nancy Quinn is a 58 y.o. female.   Patient is a 58 year old female with no significant past medical history presenting to the emergency department with chest pain.  The patient states for the last week she has had left-sided chest discomfort.  She states over the last few days it has become more consistent and more uncomfortable.  She states that last night she felt a sharp pain in her chest and has had pressure on the left side of her chest since then.  She denies any associated shortness of breath.  She denies any fever, cough or lower extremity swelling.  She denies any associated nausea or vomiting.  She states the pain does not change whether she is exercising or at rest.  Of note she does report that she went to her primary care doctor a few weeks ago and was told that her cholesterol was high and her vitamin D was low and has been anxious and stressed since then.  She denies any history of VTE, any recent long travel in the car or plane, any recent hospitalization or surgery, any cancer history or hormone use.  The history is provided by the patient.  Chest Pain      Prior to Admission medications   Medication Sig Start Date End Date Taking? Authorizing Provider  brimonidine (ALPHAGAN) 0.2 % ophthalmic solution SMARTSIG:In Eye(s) 03/30/21   [provider]  cyclobenzaprine  (FLEXERIL ) 10 MG tablet Take 0.5-1 tablets (5-10 mg total) by mouth 3 (three) times daily as needed. 02/01/23   Vivienne Delon HERO, PA-C  doxycycline  (VIBRA -TABS) 100 MG tablet Take 1 tablet (100 mg total) by mouth 2 (two) times daily. Patient not taking: Reported on 02/04/2023 01/04/20   Lendia Boby CROME, NP-C  doxycycline  (VIBRAMYCIN ) 100 MG capsule Take 100 mg by mouth daily. Patient not taking: Reported on 02/04/2023 03/20/21   [provider]  Latanoprostene Bunod (VYZULTA) 0.024 % SOLN     [provider]  lidocaine  (LIDODERM ) 5 % Place 1 patch onto the skin daily. Remove & Discard patch within 12 hours or as directed by MD Patient not taking: Reported on 02/04/2023 10/24/22   Smoot, Lauraine DELENA, PA-C  methylPREDNISolone  (MEDROL  DOSEPAK) 4 MG TBPK tablet 6 day taper; take as directed on package instructions 02/01/23   Vivienne Delon HERO, PA-C    Allergies: Penicillins    Review of Systems  Cardiovascular:  Positive for chest pain.    Updated Vital Signs BP (!) 159/87 (BP Location: Right Arm)   Pulse 88   Temp 98.1 F (36.7 C) (Axillary)   Resp 16   SpO2 98%   Physical Exam Vitals and nursing note reviewed.  Constitutional:      General: She is not in acute distress.    Appearance: She is well-developed.  HENT:     Head: Normocephalic.  Eyes:     Extraocular Movements: Extraocular movements intact.  Cardiovascular:     Rate and Rhythm: Normal rate and regular rhythm.     Pulses:          Radial pulses are 2+ on the right side and 2+ on the left side.     Heart sounds: Normal heart sounds.  Pulmonary:     Effort: Pulmonary effort is normal.     Breath sounds: Normal breath sounds.  Chest:     Chest wall: No tenderness.  Abdominal:     Palpations: Abdomen is soft.  Musculoskeletal:        General: Normal range of motion.     Cervical back: Normal range of motion and neck supple.     Right lower leg: No edema.     Left lower leg: No edema.  Skin:    General: Skin is warm and dry.  Neurological:     General: No focal deficit present.     Mental Status: She is alert and oriented to person, place, and time.  Psychiatric:        Mood and Affect: Mood normal.        Behavior: Behavior normal.     (all labs ordered are listed, but only abnormal results are displayed) Labs Reviewed  BASIC METABOLIC PANEL WITH GFR - Abnormal; Notable for the following components:      Result Value    Glucose, Bld 110 (*)    Calcium 10.4 (*)    All other components within normal limits  CBC  D-DIMER, QUANTITATIVE  TROPONIN T, HIGH SENSITIVITY  TROPONIN T, HIGH SENSITIVITY    EKG: EKG Interpretation Date/Time:  Friday October 16 2024 14:30:42 EST Ventricular Rate:  104 PR Interval:  164 QRS Duration:  74 QT Interval:  334 QTC Calculation: 439 R Axis:   62  Text Interpretation: Sinus tachycardia Biatrial enlargement Cannot rule out Anterior infarct , age undetermined Abnormal ECG No previous ECGs available Confirmed by Ellouise Fine ([id number]) on 10/16/2024 6:14:24 PM  Radiology: [facility] Chest 2 View Result Date: 10/16/2024 CLINICAL DATA:  Chest pain. EXAM: CHEST - 2 VIEW COMPARISON:  October 24, 2022 FINDINGS: The heart size and mediastinal contours are within normal limits. Both lungs are clear. The visualized skeletal structures are unremarkable. IMPRESSION: No active cardiopulmonary disease. Electronically Signed   By: Suzen Dials M.D.   On: 10/16/2024 16:07     Procedures   Medications Ordered in the ED  acetaminophen (TYLENOL) tablet 650 mg (650 mg Oral Given 10/16/24 1840)  famotidine (PEPCID) tablet 20 mg (20 mg Oral Given 10/16/24 1840)    Clinical Course as of 10/16/24 1939  Fri Oct 16, 2024  1926 D-dimer negative, making PE unlikely. Patient is stable for discharge home with outpatient follow up. [VK]    Clinical Course User Index [VK] Kingsley, Delita Chiquito K, DO                                 Medical Decision Making This patient presents to the ED with chief complaint(s) of chest pain with no pertinent past medical history which further complicates the presenting complaint. The complaint involves an extensive differential diagnosis and also carries with it a high risk of complications and morbidity.    The differential diagnosis includes ACS, arrhythmia, anemia, pneumonia, pneumothorax, pulmonary edema, pleural effusion, MSK pain, GERD, anxiety,  considering PE with tachycardia on arrival  Additional history obtained: Additional history obtained from n/A Records reviewed Primary Care Documents  ED Course and Reassessment: On patient's arrival she was initially tachycardic to the 110s and hypertensive but in no acute distress.  EKG showed sinus tachycardia without acute ischemic changes.  She had labs initiated in triage that are within normal range including a negative troponin.  Symptoms have been ongoing for several days so single troponin is sufficient.  With her tachycardia she is low risk by  Wells criteria and will have D-dimer performed to evaluate for risk of PE.  Independent labs interpretation:  The following labs were independently interpreted: within normal range  Independent visualization of imaging: - I independently visualized the following imaging with scope of interpretation limited to determining acute life threatening conditions related to emergency care: CXR, which revealed no acute disease  Consultation: - Consulted or discussed management/test interpretation w/ external professional: N/A  Consideration for admission or further workup: Patient has no emergent conditions requiring admission or further work-up at this time and is stable for discharge home with primary care follow-up  Social Determinants of health: N/A    Amount and/or Complexity of Data Reviewed Labs: ordered. Radiology: ordered.  Risk OTC drugs.       Final diagnoses:  Nonspecific chest pain    ED Discharge Orders     None          Ellouise Richerd POUR, DO 10/16/24 1939

## 2024-10-16 NOTE — ED Notes (Signed)
 ED Provider at bedside.

## 2024-10-16 NOTE — Discharge Instructions (Addendum)
 You were seen in the emergency department for your chest pain.  Your workup showed no signs of heart attack or stress on your heart, no signs of blood clots or other abnormality within your lungs.  It is unclear the etiology of your pain at this time but you can continue to take Tylenol or over-the-counter antacid as needed for pain and you should follow-up with your primary doctor in the next few days to have your symptoms rechecked.  You should return to the emergency department if your pain gets significantly worse, especially with exertion, you have severe shortness of breath, you pass out or if you have any other new or concerning symptoms.
